# Patient Record
Sex: Female | Born: 1945
Health system: Southern US, Community
[De-identification: ages and names within clinical notes are randomized; demographics above are authoritative.]

## PROBLEM LIST (undated history)

## (undated) DIAGNOSIS — E1129 Type 2 diabetes mellitus with other diabetic kidney complication: Secondary | ICD-10-CM

## (undated) DIAGNOSIS — I10 Essential (primary) hypertension: Secondary | ICD-10-CM

## (undated) DIAGNOSIS — E7800 Pure hypercholesterolemia, unspecified: Secondary | ICD-10-CM

## (undated) HISTORY — DX: Type 2 diabetes mellitus with other diabetic kidney complication: E11.29

## (undated) HISTORY — DX: Pure hypercholesterolemia, unspecified: E78.00

## (undated) HISTORY — DX: Essential (primary) hypertension: I10

---

## 2017-01-30 ENCOUNTER — Encounter: Payer: Self-pay | Admitting: Family Medicine

## 2017-01-30 ENCOUNTER — Ambulatory Visit: Payer: Self-pay | Admitting: Family Medicine

## 2017-01-30 VITALS — BP 118/78 | HR 58 | Temp 97.7°F | Ht 64.0 in | Wt 238.1 lb

## 2017-01-30 DIAGNOSIS — N183 Chronic kidney disease, stage 3 unspecified: Secondary | ICD-10-CM | POA: Insufficient documentation

## 2017-01-30 DIAGNOSIS — J309 Allergic rhinitis, unspecified: Secondary | ICD-10-CM

## 2017-01-30 DIAGNOSIS — Z23 Encounter for immunization: Secondary | ICD-10-CM | POA: Diagnosis not present

## 2017-01-30 DIAGNOSIS — E7800 Pure hypercholesterolemia, unspecified: Secondary | ICD-10-CM

## 2017-01-30 DIAGNOSIS — Z794 Long term (current) use of insulin: Secondary | ICD-10-CM

## 2017-01-30 DIAGNOSIS — F339 Major depressive disorder, recurrent, unspecified: Secondary | ICD-10-CM | POA: Diagnosis not present

## 2017-01-30 DIAGNOSIS — I10 Essential (primary) hypertension: Secondary | ICD-10-CM

## 2017-01-30 DIAGNOSIS — E1122 Type 2 diabetes mellitus with diabetic chronic kidney disease: Secondary | ICD-10-CM

## 2017-01-30 LAB — COMPREHENSIVE METABOLIC PANEL
ALBUMIN: 4.3 g/dL (ref 3.5–5.2)
ALK PHOS: 95 U/L (ref 39–117)
ALT: 16 U/L (ref 0–35)
AST: 17 U/L (ref 0–37)
BILIRUBIN TOTAL: 0.7 mg/dL (ref 0.2–1.2)
BUN: 30 mg/dL — AB (ref 6–23)
CO2: 27 mEq/L (ref 19–32)
CREATININE: 1.21 mg/dL — AB (ref 0.40–1.20)
Calcium: 9.9 mg/dL (ref 8.4–10.5)
Chloride: 101 mEq/L (ref 96–112)
GFR: 46.56 mL/min — ABNORMAL LOW (ref >60.00)
GLUCOSE: 125 mg/dL — AB (ref 70–99)
POTASSIUM: 4.7 meq/L (ref 3.5–5.1)
SODIUM: 137 meq/L (ref 135–145)
TOTAL PROTEIN: 7.4 g/dL (ref 6.0–8.3)

## 2017-01-30 LAB — MICROALBUMIN / CREATININE URINE RATIO
CREATININE, U: 157.6 mg/dL
Microalb Creat Ratio: 0.5 mg/g (ref 0.0–30.0)
Microalb, Ur: 0.8 mg/dL (ref 0.0–1.9)

## 2017-01-30 LAB — CBC
HEMATOCRIT: 40.5 % (ref 36.0–46.0)
HEMOGLOBIN: 13.1 g/dL (ref 12.0–15.0)
MCHC: 32.2 g/dL (ref 30.0–36.0)
MCV: 80.5 fl (ref 78.0–100.0)
PLATELETS: 297 10*3/uL (ref 150.0–400.0)
RBC: 5.04 Mil/uL (ref 3.87–5.11)
RDW: 14.7 % (ref 11.5–15.5)
WBC: 10.1 10*3/uL (ref 4.0–10.5)

## 2017-01-30 LAB — URINALYSIS, ROUTINE W REFLEX MICROSCOPIC
BILIRUBIN URINE: NEGATIVE
HGB URINE DIPSTICK: NEGATIVE
KETONES UR: NEGATIVE
LEUKOCYTES UA: NEGATIVE
NITRITE: NEGATIVE
PH: 5.5 (ref 5.0–8.0)
RBC / HPF: NONE SEEN (ref 0–?)
Specific Gravity, Urine: >=1.03 — AB (ref 1.000–1.030)
Total Protein, Urine: NEGATIVE
UROBILINOGEN UA: 0.2 (ref 0.0–1.0)
Urine Glucose: NEGATIVE

## 2017-01-30 LAB — LIPID PANEL
CHOLESTEROL: 121 mg/dL (ref 0–200)
HDL: 52.6 mg/dL (ref >39.00)
LDL Cholesterol: 53 mg/dL (ref 0–99)
NONHDL: 68.35
Total CHOL/HDL Ratio: 2
Triglycerides: 78 mg/dL (ref 0.0–149.0)
VLDL: 15.6 mg/dL (ref 0.0–40.0)

## 2017-01-30 LAB — TSH: TSH: 3.21 u[IU]/mL (ref 0.35–4.50)

## 2017-01-30 LAB — HEMOGLOBIN A1C: Hgb A1c MFr Bld: 8.1 % — ABNORMAL HIGH (ref 4.6–6.5)

## 2017-01-30 MED ORDER — ATORVASTATIN CALCIUM 20 MG PO TABS: 20.0000 mg | ORAL_TABLET | ORAL | 1 refills | Status: DC

## 2017-01-30 MED ORDER — EXENATIDE ER 2 MG ~~LOC~~ PEN: 2.0000 mg | PEN_INJECTOR | SUBCUTANEOUS | 1 refills | Status: DC

## 2017-01-30 MED ORDER — INSULIN GLARGINE 100 UNIT/ML ~~LOC~~ SOLN: SUBCUTANEOUS | 1 refills | Status: DC

## 2017-01-30 MED ORDER — FLUOXETINE HCL 40 MG PO CAPS: 40.0000 mg | ORAL_CAPSULE | Freq: Every day | ORAL | 1 refills | Status: DC

## 2017-01-30 MED ORDER — FEXOFENADINE HCL 180 MG PO TABS: 180.0000 mg | ORAL_TABLET | Freq: Every day | ORAL | 1 refills | Status: AC

## 2017-01-30 MED ORDER — MOEXIPRIL-HYDROCHLOROTHIAZIDE 15-25 MG PO TABS: 1.0000 | ORAL_TABLET | Freq: Every day | ORAL | 1 refills | Status: DC

## 2017-01-30 MED ORDER — ASPIRIN EC 81 MG PO TBEC: 81.0000 mg | DELAYED_RELEASE_TABLET | Freq: Every day | ORAL | 0 refills | Status: AC

## 2017-01-30 MED ORDER — "INSULIN SYRINGE-NEEDLE U-100 30G X 5/16"" 0.3 ML MISC": 1.0000 | Freq: Every day | 1 refills | Status: DC

## 2017-01-30 NOTE — Progress Notes (Addendum)
Subjective:  Patient ID: Sierra Hines, female    DOB: 1945-05-26  Age: 71 y.o. MRN: [id number]  CC: Follow-up (diabetes)   HPI Sierra Hines presents for follow-up of her ongoing medical issues.  She has seen the doctor is now also under their care.  She is using 45 units of Lantus nightly.  Her fasting blood sugars are in the 85-120 range.  However her 2-hour postprandial sugars are in the 170 range.  She is only taking Lantus at this time.  She has taken examined in the past without issue.  She agrees to retry it again today.  She lives alone and has a significant other.  She has 1 daughter and 4 grandchildren.  They will be visiting her mo Thanksgiving. Her ex lives in [place].   She is exercising.  She has an appointment with the nephrologist this afternoon.    History Sierra Hines has a past medical history of DM (diabetes mellitus) type II controlled with renal manifestation ([place]), Elevated LDL cholesterol level, and HTN (hypertension), benign.   She has a past surgical history that includes Cesarean section (1980).   Her family history is not on file.She reports that she has quit smoking. she has never used smokeless tobacco. Her alcohol and drug histories are not on file.  Outpatient Medications Prior to Visit  Medication Sig Dispense Refill  . atorvastatin (LIPITOR) 20 MG tablet Take 1 tablet by mouth daily.  1  . fexofenadine (ALLEGRA) 180 MG tablet Take 180 mg by mouth. Take 1 tablet by mouth daily.    Marland Kitchen FLUoxetine (PROZAC) 40 MG capsule Take 1 capsule by mouth daily.  1  . insulin glargine (LANTUS) 100 UNIT/ML injection INJ 15 UNI [place] TONIGHT HS. INCREASE BY 2 UNI NIGHTLY UNTIL FASTING SUGAR IS LESS THAN 130    . moexipril-hydrochlorothiazide (UNIRETIC) 15-25 MG tablet Take 1 tablet by mouth daily.  1   No facility-administered medications prior to visit.     ROS Review of Systems  Constitutional: Negative for activity change, appetite change, fatigue and fever.  HENT:  Positive for postnasal drip and sneezing. Negative for voice change.   Eyes: Negative.   Respiratory: Negative.   Cardiovascular: Negative.   Gastrointestinal: Negative.   Endocrine: Negative for polydipsia, polyphagia and polyuria.  Genitourinary: Negative for difficulty urinating and hematuria.  Musculoskeletal: Negative for arthralgias and myalgias.  Allergic/Immunologic: Positive for environmental allergies.  Neurological: Negative for headaches.  Hematological: Negative.   Psychiatric/Behavioral: Negative for dysphoric mood, self-injury and suicidal ideas. The patient is not nervous/anxious.     Objective:  BP 118/78   Pulse (!) 58   Temp 97.7 F (36.5 C) (Oral)   Ht '5\' 4"'$  (1.626 m)   Wt 238 lb 2 oz (108 kg)   SpO2 100%   BMI 40.87 kg/m   Physical Exam  Constitutional: She is oriented to person, place, and time. She appears well-developed and well-nourished. No distress.  HENT:  Head: Normocephalic and atraumatic.  Right Ear: External ear normal.  Left Ear: External ear normal.  Mouth/Throat: Oropharynx is clear and moist. No oropharyngeal exudate.  Eyes: Conjunctivae are normal. Pupils are equal, round, and reactive to light. Right eye exhibits no discharge. Left eye exhibits no discharge. No scleral icterus.  Neck: Neck supple. No JVD present. No tracheal deviation present. No thyromegaly present.  Cardiovascular: Normal rate, regular rhythm and normal heart sounds.  Pulmonary/Chest: Effort normal and breath sounds normal. No stridor.  Abdominal: Bowel sounds are normal.  Musculoskeletal: She exhibits no edema or tenderness.  Lymphadenopathy:    She has no cervical adenopathy.  Neurological: She is alert and oriented to person, place, and time.  Skin: Skin is warm and dry. She is not diaphoretic.  Psychiatric: She has a normal mood and affect. Her behavior is normal. Thought content normal.    FH, social hx and pmh reviewed.  Assessment & Plan:   Sierra Hines was seen  today for follow-up.  Diagnoses and all orders for this visit:  Type 2 diabetes mellitus with stage 3 chronic kidney disease, with long-term current use of insulin (HCC) -     insulin glargine (LANTUS) 100 UNIT/ML injection; May use up to 60 Units nightly as directed -     moexipril-hydrochlorothiazide (UNIRETIC) 15-25 MG tablet; Take 1 tablet daily by mouth. Take 1 tablet by mouth daily. -     Exenatide ER 2 MG PEN; Inject 2 mg once a week into the skin. -     Comp Met (CMET) -     CBC -     TSH -     Urinalysis, Routine w reflex microscopic -     Urine Microalbumin w/creat. ratio -     HgB A1c -     Insulin Syringe-Needle U-100 (GLOBAL INSULIN SYRINGES) 30G X 5/16" 0.3 ML MISC; 1 Syringe daily by Does not apply route. -     aspirin EC 81 MG tablet; Take 1 tablet (81 mg total) daily by mouth.  Allergic rhinitis, unspecified seasonality, unspecified trigger -     fexofenadine (ALLEGRA) 180 MG tablet; Take 1 tablet (180 mg total) daily by mouth. Take 1 tablet by mouth daily.  HTN (hypertension), benign -     moexipril-hydrochlorothiazide (UNIRETIC) 15-25 MG tablet; Take 1 tablet daily by mouth. Take 1 tablet by mouth daily. -     Comp Met (CMET) -     CBC -     TSH -     Urinalysis, Routine w reflex microscopic -     aspirin EC 81 MG tablet; Take 1 tablet (81 mg total) daily by mouth.  Depression, recurrent (HCC) -     FLUoxetine (PROZAC) 40 MG capsule; Take 1 capsule (40 mg total) daily by mouth. Take 1 capsule by mouth daily.  Elevated LDL cholesterol level -     atorvastatin (LIPITOR) 20 MG tablet; Take 1 tablet (20 mg total) 1 day or 1 dose for 1 dose by mouth. Take 1 tablet by mouth daily. -     Comp Met (CMET) -     Lipid Profile -     TSH -     aspirin EC 81 MG tablet; Take 1 tablet (81 mg total) daily by mouth.  Need for influenza vaccination -     Flu vaccine HIGH DOSE PF   I have changed Sierra Hines's atorvastatin, fexofenadine, FLUoxetine, insulin glargine, and  moexipril-hydrochlorothiazide. I am also having her start on Exenatide ER, Insulin Syringe-Needle U-100, and aspirin EC.  Meds ordered this encounter  Medications  . DISCONTD: moexipril-hydrochlorothiazide (UNIRETIC) 15-25 MG tablet    Sig: Take 1 tablet by mouth daily.    Refill:  1  . DISCONTD: insulin glargine (LANTUS) 100 UNIT/ML injection    Sig: INJ 15 UNI [place] TONIGHT HS. INCREASE BY 2 UNI NIGHTLY UNTIL FASTING SUGAR IS LESS THAN 130  . DISCONTD: FLUoxetine (PROZAC) 40 MG capsule    Sig: Take 1 capsule by mouth daily.    Refill:  1  .  DISCONTD: fexofenadine (ALLEGRA) 180 MG tablet    Sig: Take 180 mg by mouth. Take 1 tablet by mouth daily.  Marland Kitchen DISCONTD: atorvastatin (LIPITOR) 20 MG tablet    Sig: Take 1 tablet by mouth daily.    Refill:  1  . atorvastatin (LIPITOR) 20 MG tablet    Sig: Take 1 tablet (20 mg total) 1 day or 1 dose for 1 dose by mouth. Take 1 tablet by mouth daily.    Dispense:  180 tablet    Refill:  1  . fexofenadine (ALLEGRA) 180 MG tablet    Sig: Take 1 tablet (180 mg total) daily by mouth. Take 1 tablet by mouth daily.    Dispense:  180 tablet    Refill:  1  . FLUoxetine (PROZAC) 40 MG capsule    Sig: Take 1 capsule (40 mg total) daily by mouth. Take 1 capsule by mouth daily.    Dispense:  180 capsule    Refill:  1  . insulin glargine (LANTUS) 100 UNIT/ML injection    Sig: May use up to 60 Units nightly as directed    Dispense:  30 mL    Refill:  1  . moexipril-hydrochlorothiazide (UNIRETIC) 15-25 MG tablet    Sig: Take 1 tablet daily by mouth. Take 1 tablet by mouth daily.    Dispense:  180 tablet    Refill:  1  . Exenatide ER 2 MG PEN    Sig: Inject 2 mg once a week into the skin.    Dispense:  12 each    Refill:  1  . Insulin Syringe-Needle U-100 (GLOBAL INSULIN SYRINGES) 30G X 5/16" 0.3 ML MISC    Sig: 1 Syringe daily by Does not apply route.    Dispense:  100 each    Refill:  1  . aspirin EC 81 MG tablet    Sig: Take 1 tablet (81 mg total)  daily by mouth.    Dispense:  365 tablet    Refill:  0   I have encouraged her to continue exercising with weight loss.  Follow-up: Return in about 3 months (around 05/02/2017).  Libby Maw, MD

## 2017-03-08 ENCOUNTER — Telehealth: Payer: Self-pay | Admitting: *Deleted

## 2017-03-08 NOTE — Telephone Encounter (Signed)
[facility]. Medical records will be sent interoffice to [facility].

## 2017-04-05 ENCOUNTER — Telehealth: Payer: Self-pay | Admitting: Family Medicine

## 2017-04-05 NOTE — Telephone Encounter (Signed)
Spoke with Sierra Hines. Patient stated that she is not interested in scheduling AWV at this time. SF

## 2017-05-02 ENCOUNTER — Ambulatory Visit: Payer: Medicare Other | Admitting: Family Medicine

## 2017-05-02 ENCOUNTER — Encounter: Payer: Self-pay | Admitting: Family Medicine

## 2017-05-02 VITALS — BP 120/70 | HR 120 | Ht 64.0 in | Wt 233.5 lb

## 2017-05-02 DIAGNOSIS — I10 Essential (primary) hypertension: Secondary | ICD-10-CM

## 2017-05-02 DIAGNOSIS — E7800 Pure hypercholesterolemia, unspecified: Secondary | ICD-10-CM | POA: Diagnosis not present

## 2017-05-02 DIAGNOSIS — N183 Chronic kidney disease, stage 3 unspecified: Secondary | ICD-10-CM

## 2017-05-02 DIAGNOSIS — Z794 Long term (current) use of insulin: Secondary | ICD-10-CM

## 2017-05-02 DIAGNOSIS — E1122 Type 2 diabetes mellitus with diabetic chronic kidney disease: Secondary | ICD-10-CM | POA: Diagnosis not present

## 2017-05-02 DIAGNOSIS — Z1159 Encounter for screening for other viral diseases: Secondary | ICD-10-CM

## 2017-05-02 DIAGNOSIS — M8589 Other specified disorders of bone density and structure, multiple sites: Secondary | ICD-10-CM

## 2017-05-02 LAB — MICROALBUMIN / CREATININE URINE RATIO
CREATININE, U: 224.9 mg/dL
MICROALB UR: 1.4 mg/dL (ref 0.0–1.9)
Microalb Creat Ratio: 0.6 mg/g (ref 0.0–30.0)

## 2017-05-02 LAB — BASIC METABOLIC PANEL
BUN: 26 mg/dL — ABNORMAL HIGH (ref 6–23)
CALCIUM: 9.7 mg/dL (ref 8.4–10.5)
CO2: 28 meq/L (ref 19–32)
Chloride: 100 mEq/L (ref 96–112)
Creatinine, Ser: 1.3 mg/dL — ABNORMAL HIGH (ref 0.40–1.20)
GFR: 42.83 mL/min — AB (ref >60.00)
Glucose, Bld: 132 mg/dL — ABNORMAL HIGH (ref 70–99)
Potassium: 4.4 mEq/L (ref 3.5–5.1)
SODIUM: 137 meq/L (ref 135–145)

## 2017-05-02 LAB — HEMOGLOBIN A1C: HEMOGLOBIN A1C: 6.9 % — AB (ref 4.6–6.5)

## 2017-05-02 MED ORDER — INSULIN GLARGINE 100 UNIT/ML ~~LOC~~ SOLN: SUBCUTANEOUS | 1 refills | Status: DC

## 2017-05-02 MED ORDER — "INSULIN SYRINGE-NEEDLE U-100 30G X 5/16"" 1 ML MISC": 3 refills | Status: DC

## 2017-05-02 MED ORDER — EXENATIDE ER 2 MG ~~LOC~~ PEN: 2.0000 mg | PEN_INJECTOR | SUBCUTANEOUS | 1 refills | Status: DC

## 2017-05-02 NOTE — Progress Notes (Addendum)
Subjective:  Patient ID: Sierra Hines, female    DOB: [birth year]  Age: 72 y.o. MRN: [id number]  CC: Follow-up   HPI Sierra Hines presents for seems to be doing better.  She is tolerating the examiner Tide weekly.  Feels as though her postprandial blood glucoses have been improved with it.  She is taking 50 units of Lantus nightly.  Fasting sugars have been in the less than 120 range.  She continues to walk for exercise.  She is staying socially engaged with her female friend.  She sees her grandchildren in [place] as much as possible.  Blood pressures been controlled.  She is checking her feet.  She is she will have her eye exam in May.  Last Pap smear was back in the fall and was normal.  Renal docs checked her vitamin D and found it to be low and have got her on the high-dose replacement.  She will follow-up with him in March.  Outpatient Medications Prior to Visit  Medication Sig Dispense Refill  . aspirin EC 81 MG tablet Take 1 tablet (81 mg total) daily by mouth. 365 tablet 0  . fexofenadine (ALLEGRA) 180 MG tablet Take 1 tablet (180 mg total) daily by mouth. Take 1 tablet by mouth daily. 180 tablet 1  . FLUoxetine (PROZAC) 40 MG capsule Take 1 capsule (40 mg total) daily by mouth. Take 1 capsule by mouth daily. 180 capsule 1  . Vitamin D, Ergocalciferol, (DRISDOL) 50000 units CAPS capsule Take 1 capsule by mouth once a week.    . Exenatide ER 2 MG PEN Inject 2 mg once a week into the skin. 12 each 1  . insulin glargine (LANTUS) 100 UNIT/ML injection May use up to 60 Units nightly as directed 30 mL 1  . moexipril-hydrochlorothiazide (UNIRETIC) 15-25 MG tablet Take 1 tablet daily by mouth. Take 1 tablet by mouth daily. 180 tablet 1  . atorvastatin (LIPITOR) 20 MG tablet Take 1 tablet (20 mg total) 1 day or 1 dose for 1 dose by mouth. Take 1 tablet by mouth daily. 180 tablet 1  . Insulin Syringe-Needle U-100 (GLOBAL INSULIN SYRINGES) 30G X 5/16" 0.3 ML MISC 1 Syringe daily by Does not apply  route. 100 each 1   No facility-administered medications prior to visit.     ROS Review of Systems  Constitutional: Negative.   HENT: Negative.   Eyes: Negative.   Respiratory: Negative.   Cardiovascular: Negative.   Gastrointestinal: Negative.   Endocrine: Negative for polyphagia and polyuria.  Genitourinary: Negative.   Musculoskeletal: Negative.   Skin: Negative.   Allergic/Immunologic: Negative for immunocompromised state.  Neurological: Negative for weakness and headaches.  Hematological: Does not bruise/bleed easily.  Psychiatric/Behavioral: Negative.     Objective:  BP 120/70 (BP Location: Left Arm, Patient Position: Sitting, Cuff Size: Large)   Pulse (!) 120   Ht 5\' 4"  (1.626 m)   Wt 233 lb 8 oz (105.9 kg)   SpO2 97%   BMI 40.08 kg/m   BP Readings from Last 3 Encounters:  05/02/17 120/70  01/30/17 118/78    Wt Readings from Last 3 Encounters:  05/02/17 233 lb 8 oz (105.9 kg)  01/30/17 238 lb 2 oz (108 kg)    Physical Exam  Constitutional: She is oriented to person, place, and time. She appears well-developed and well-nourished. No distress.  HENT:  Head: Normocephalic and atraumatic.  Right Ear: External ear normal.  Left Ear: External ear normal.  Mouth/Throat: Oropharynx is clear and  moist. No oropharyngeal exudate.  Eyes: Conjunctivae are normal. Pupils are equal, round, and reactive to light. Right eye exhibits no discharge. Left eye exhibits no discharge. No scleral icterus.  Neck: Neck supple. No JVD present. No tracheal deviation present. No thyromegaly present.  Cardiovascular: Normal rate, regular rhythm and normal heart sounds.  Pulses:      Dorsalis pedis pulses are 1+ on the right side, and 1+ on the left side.       Posterior tibial pulses are 1+ on the right side, and 1+ on the left side.  Pulmonary/Chest: Effort normal and breath sounds normal.  Abdominal: She exhibits no distension. There is no tenderness. There is no rebound.    Lymphadenopathy:    She has no cervical adenopathy.  Neurological: She is alert and oriented to person, place, and time.  Skin: Skin is warm and dry. She is not diaphoretic.  Psychiatric: She has a normal mood and affect. Her behavior is normal.   Diabetic Foot Exam   Diabetic Foot Exam - Simple   Simple Foot Form  Visual Inspection  No deformities, no ulcerations, no other skin breakdown bilaterally: Yes  Sensation Testing  Intact to touch and monofilament testing bilaterally: Yes  Pulse Check  Posterior Tibialis and Dorsalis pulse intact bilaterally: Yes  Comments   Lab Results  Component Value Date   WBC 10.1 01/30/2017   HGB 13.1 01/30/2017   HCT 40.5 01/30/2017   PLT 297.0 01/30/2017   GLUCOSE 132 (H) 05/02/2017   CHOL 121 01/30/2017   TRIG 78.0 01/30/2017   HDL 52.60 01/30/2017   LDLCALC 53 01/30/2017   ALT 16 01/30/2017   AST 17 01/30/2017   NA 137 05/02/2017   K 4.4 05/02/2017   CL 100 05/02/2017   CREATININE 1.30 (H) 05/02/2017   BUN 26 (H) 05/02/2017   CO2 28 05/02/2017   TSH 3.21 01/30/2017   HGBA1C 6.9 (H) 05/02/2017   MICROALBUR 1.4 05/02/2017    Patient was never admitted.  Assessment & Plan:   Sierra Hines was seen today for follow-up.  Diagnoses and all orders for this visit:  HTN (hypertension), benign -     lisinopril-hydrochlorothiazide (PRINZIDE,ZESTORETIC) 20-25 MG tablet; Take 1 tablet by mouth daily.  Type 2 diabetes mellitus with stage 3 chronic kidney disease, with long-term current use of insulin (HCC) -     Exenatide ER 2 MG PEN; Inject 2 mg into the skin once a week. -     insulin glargine (LANTUS) 100 UNIT/ML injection; May use up to 60 Units nightly as directed -     Basic metabolic panel -     Hemoglobin A1c -     Microalbumin / creatinine urine ratio -     lisinopril-hydrochlorothiazide (PRINZIDE,ZESTORETIC) 20-25 MG tablet; Take 1 tablet by mouth daily.  Elevated LDL cholesterol level  Encounter for hepatitis C screening test  for low risk patient -     Hepatitis C antibody  Osteopenia of multiple sites -     DG Bone Density; Future  Other orders -     Insulin Syringe-Needle U-100 30G X 5/16" 1 ML MISC; To use with insulin.   I have discontinued Sierra Hines's moexipril-hydrochlorothiazide and Insulin Syringe-Needle U-100. I have also changed her Exenatide ER. Additionally, I am having her start on Insulin Syringe-Needle U-100 and lisinopril-hydrochlorothiazide. Lastly, I am having her maintain her atorvastatin, fexofenadine, FLUoxetine, aspirin EC, Vitamin D (Ergocalciferol), and insulin glargine.  Meds ordered this encounter  Medications  . Insulin  Syringe-Needle U-100 30G X 5/16" 1 ML MISC    Sig: To use with insulin.    Dispense:  100 each    Refill:  3  . Exenatide ER 2 MG PEN    Sig: Inject 2 mg into the skin once a week.    Dispense:  12 each    Refill:  1  . insulin glargine (LANTUS) 100 UNIT/ML injection    Sig: May use up to 60 Units nightly as directed    Dispense:  60 mL    Refill:  1  . lisinopril-hydrochlorothiazide (PRINZIDE,ZESTORETIC) 20-25 MG tablet    Sig: Take 1 tablet by mouth daily.    Dispense:  90 tablet    Refill:  3     Follow-up: Return in about 3 months (around 07/30/2017).  Mliss SaxWilliam Alfred Latrise Bowland, MD

## 2017-05-03 ENCOUNTER — Other Ambulatory Visit (HOSPITAL_BASED_OUTPATIENT_CLINIC_OR_DEPARTMENT_OTHER): Payer: Medicare Other

## 2017-05-03 LAB — HEPATITIS C ANTIBODY
HEP C AB: NONREACTIVE
SIGNAL TO CUT-OFF: 0.01 (ref <1.00)

## 2017-05-09 ENCOUNTER — Other Ambulatory Visit: Payer: Self-pay

## 2017-05-09 ENCOUNTER — Telehealth: Payer: Self-pay | Admitting: Family Medicine

## 2017-05-09 ENCOUNTER — Ambulatory Visit (HOSPITAL_BASED_OUTPATIENT_CLINIC_OR_DEPARTMENT_OTHER)
Admission: RE | Admit: 2017-05-09 | Discharge: 2017-05-09 | Disposition: A | Discharge status: Home / Self Care | Payer: Medicare Other | Source: Ambulatory Visit | Attending: Family Medicine | Admitting: Family Medicine

## 2017-05-09 DIAGNOSIS — F339 Major depressive disorder, recurrent, unspecified: Secondary | ICD-10-CM

## 2017-05-09 DIAGNOSIS — M8589 Other specified disorders of bone density and structure, multiple sites: Secondary | ICD-10-CM

## 2017-05-09 DIAGNOSIS — E7800 Pure hypercholesterolemia, unspecified: Secondary | ICD-10-CM

## 2017-05-09 MED ORDER — ATORVASTATIN CALCIUM 20 MG PO TABS: 20.0000 mg | ORAL_TABLET | Freq: Every day | ORAL | 1 refills | Status: DC

## 2017-05-09 MED ORDER — FLUOXETINE HCL 40 MG PO CAPS: 40.0000 mg | ORAL_CAPSULE | Freq: Every day | ORAL | 1 refills | Status: DC

## 2017-05-09 MED ORDER — LISINOPRIL-HYDROCHLOROTHIAZIDE 20-25 MG PO TABS: 1.0000 | ORAL_TABLET | Freq: Every day | ORAL | 3 refills | Status: DC

## 2017-05-09 NOTE — Telephone Encounter (Signed)
[facility] the moexipril/hctz 15-25 mg tablets have been discontinued. What would you like to change it too?

## 2017-05-09 NOTE — Telephone Encounter (Signed)
I left a detailed voicemail for patient letting her know to discontinue the current BP medication and to start the new one that has been sent in. I also let patient know that she needs to make an appointment for a month away so that we can check and make sure her BP is doing well on the new medication. Advised patient to call the office back if she has any questions/concerns.

## 2017-05-09 NOTE — Addendum Note (Signed)
Addended by: Andrez GrimeKREMER, Robbie Nangle A on: 05/09/2017 11:55 AM   Modules accepted: Orders

## 2017-05-09 NOTE — Telephone Encounter (Signed)
I switched her to zestoretic. Stop the uniretic. Fu in one month for a bp check.

## 2017-07-31 ENCOUNTER — Encounter: Payer: Self-pay | Admitting: Family Medicine

## 2017-07-31 ENCOUNTER — Ambulatory Visit: Payer: Medicare Other | Admitting: Family Medicine

## 2017-07-31 VITALS — BP 130/80 | HR 116 | Ht 64.0 in | Wt 233.4 lb

## 2017-07-31 DIAGNOSIS — Z794 Long term (current) use of insulin: Secondary | ICD-10-CM

## 2017-07-31 DIAGNOSIS — M85859 Other specified disorders of bone density and structure, unspecified thigh: Secondary | ICD-10-CM

## 2017-07-31 DIAGNOSIS — E1122 Type 2 diabetes mellitus with diabetic chronic kidney disease: Secondary | ICD-10-CM

## 2017-07-31 DIAGNOSIS — N183 Chronic kidney disease, stage 3 unspecified: Secondary | ICD-10-CM

## 2017-07-31 DIAGNOSIS — E7800 Pure hypercholesterolemia, unspecified: Secondary | ICD-10-CM

## 2017-07-31 DIAGNOSIS — E559 Vitamin D deficiency, unspecified: Secondary | ICD-10-CM | POA: Diagnosis not present

## 2017-07-31 DIAGNOSIS — I10 Essential (primary) hypertension: Secondary | ICD-10-CM | POA: Diagnosis not present

## 2017-07-31 LAB — LIPID PANEL
CHOLESTEROL: 128 mg/dL (ref 0–200)
HDL: 56.3 mg/dL (ref >39.00)
LDL Cholesterol: 47 mg/dL (ref 0–99)
NonHDL: 72.18
Total CHOL/HDL Ratio: 2
Triglycerides: 125 mg/dL (ref 0.0–149.0)
VLDL: 25 mg/dL (ref 0.0–40.0)

## 2017-07-31 LAB — BASIC METABOLIC PANEL
BUN: 26 mg/dL — ABNORMAL HIGH (ref 6–23)
CO2: 24 mEq/L (ref 19–32)
Calcium: 9.4 mg/dL (ref 8.4–10.5)
Chloride: 100 mEq/L (ref 96–112)
Creatinine, Ser: 1.53 mg/dL — ABNORMAL HIGH (ref 0.40–1.20)
GFR: 35.47 mL/min — ABNORMAL LOW (ref >60.00)
Glucose, Bld: 127 mg/dL — ABNORMAL HIGH (ref 70–99)
POTASSIUM: 4.3 meq/L (ref 3.5–5.1)
Sodium: 135 mEq/L (ref 135–145)

## 2017-07-31 LAB — HEMOGLOBIN A1C: HEMOGLOBIN A1C: 6.5 % (ref 4.6–6.5)

## 2017-07-31 LAB — CBC
HCT: 40.1 % (ref 36.0–46.0)
Hemoglobin: 13.1 g/dL (ref 12.0–15.0)
MCHC: 32.6 g/dL (ref 30.0–36.0)
MCV: 78.4 fl (ref 78.0–100.0)
Platelets: 320 10*3/uL (ref 150.0–400.0)
RBC: 5.11 Mil/uL (ref 3.87–5.11)
RDW: 15.2 % (ref 11.5–15.5)
WBC: 10.2 10*3/uL (ref 4.0–10.5)

## 2017-07-31 LAB — MICROALBUMIN / CREATININE URINE RATIO
Creatinine,U: 272.6 mg/dL
MICROALB UR: 51.5 mg/dL — AB (ref 0.0–1.9)
MICROALB/CREAT RATIO: 18.9 mg/g (ref 0.0–30.0)

## 2017-07-31 MED ORDER — CALCIUM 500 MG PO TABS: ORAL_TABLET | ORAL | 3 refills | Status: AC

## 2017-07-31 NOTE — Progress Notes (Signed)
Subjective:  Patient ID: Sierra Hines, female    DOB: May 29, 1945  Age: 72 y.o. MRN: [id number]  CC: Follow-up   HPI Sierra Hines presents for follow-up of her blood pressure diabetes and developed elevated LDL cholesterol.  She is fasting this morning.  Blood sugars have been doing much better with a combination of 50 units of Lantus in the evening and a exenatide once weekly.  She has been adjusting her Lantus by her evening blood sugars.  She is feeling better and is exercising more.  Renal function is improving.  Retinal exam he has stable without diabetic retinopathy.  There is some evidence for early MD.  Ophthalmology does not feel as though it needs to be treated at this point.  Outpatient Medications Prior to Visit  Medication Sig Dispense Refill  . aspirin EC 81 MG tablet Take 1 tablet (81 mg total) daily by mouth. 365 tablet 0  . Exenatide ER 2 MG PEN Inject 2 mg into the skin once a week. 12 each 1  . fexofenadine (ALLEGRA) 180 MG tablet Take 1 tablet (180 mg total) daily by mouth. Take 1 tablet by mouth daily. 180 tablet 1  . FLUoxetine (PROZAC) 40 MG capsule Take 1 capsule (40 mg total) by mouth daily. Take 1 capsule by mouth daily. 90 capsule 1  . insulin glargine (LANTUS) 100 UNIT/ML injection May use up to 60 Units nightly as directed 60 mL 1  . Insulin Syringe-Needle U-100 30G X 5/16" 1 ML MISC To use with insulin. 100 each 3  . lisinopril-hydrochlorothiazide (PRINZIDE,ZESTORETIC) 20-25 MG tablet Take 1 tablet by mouth daily. 90 tablet 3  . Vitamin D, Ergocalciferol, (DRISDOL) 50000 units CAPS capsule Take 1 capsule by mouth once a week.    Marland Kitchen atorvastatin (LIPITOR) 20 MG tablet Take 1 tablet (20 mg total) by mouth daily for 1 dose. 90 tablet 1   No facility-administered medications prior to visit.     ROS Review of Systems  Constitutional: Negative for activity change, chills, fatigue and unexpected weight change.  HENT: Negative.   Eyes: Negative for photophobia and  visual disturbance.  Respiratory: Negative.   Cardiovascular: Negative.   Gastrointestinal: Negative.   Endocrine: Negative for polyphagia and polyuria.  Genitourinary: Negative for frequency and urgency.  Musculoskeletal: Negative for gait problem and joint swelling.  Skin: Negative.   Allergic/Immunologic: Negative for immunocompromised state.  Neurological: Negative for weakness and headaches.  Hematological: Does not bruise/bleed easily.  Psychiatric/Behavioral: Negative.     Objective:  BP 130/80   Pulse (!) 116   Ht  (1.626 m)   Wt 233 lb 6 oz (105.9 kg)   SpO2 97%   BMI 40.06 kg/m   BP Readings from Last 3 Encounters:  07/31/17 130/80  05/02/17 120/70  01/30/17 118/78    Wt Readings from Last 3 Encounters:  07/31/17 233 lb 6 oz (105.9 kg)  05/02/17 233 lb 8 oz (105.9 kg)  01/30/17 238 lb 2 oz (108 kg)    Physical Exam  Constitutional: She is oriented to person, place, and time. She appears well-developed and well-nourished.  HENT:  Head: Normocephalic and atraumatic.  Right Ear: External ear normal.  Left Ear: External ear normal.  Eyes: Right eye exhibits no discharge. Left eye exhibits no discharge. No scleral icterus.  Neck: No JVD present. No tracheal deviation present.  Cardiovascular: Normal rate, regular rhythm and normal heart sounds.  Pulses:      Dorsalis pedis pulses are 1+ on the  right side, and 1+ on the left side.  Pulmonary/Chest: Effort normal.  Musculoskeletal: She exhibits no edema.  Neurological: She is alert and oriented to person, place, and time.  Skin: Skin is warm and dry. Capillary refill takes 2 to 3 seconds.  Psychiatric: She has a normal mood and affect. Her behavior is normal.   Diabetic Foot Exam - Simple   Simple Foot Form  Visual Inspection  No deformities, no ulcerations, no other skin breakdown bilaterally: Yes  Sensation Testing  Intact to touch and monofilament testing bilaterally: Yes  Pulse Check  See  comments: Yes  Comments  Feet are cavus.   Could not find DP pulses.   Cap refill 2-3 seconds.    Lab Results  Component Value Date   WBC 10.1 01/30/2017   HGB 13.1 01/30/2017   HCT 40.5 01/30/2017   PLT 297.0 01/30/2017   GLUCOSE 132 (H) 05/02/2017   CHOL 121 01/30/2017   TRIG 78.0 01/30/2017   HDL 52.60 01/30/2017   LDLCALC 53 01/30/2017   ALT 16 01/30/2017   AST 17 01/30/2017   NA 137 05/02/2017   K 4.4 05/02/2017   CL 100 05/02/2017   CREATININE 1.30 (H) 05/02/2017   BUN 26 (H) 05/02/2017   CO2 28 05/02/2017   TSH 3.21 01/30/2017   HGBA1C 6.9 (H) 05/02/2017   MICROALBUR 1.4 05/02/2017    Dg Bone Density  Result Date: 05/09/2017 EXAM: DUAL X-RAY ABSORPTIOMETRY (DXA) FOR BONE MINERAL DENSITY IMPRESSION: Referring Physician:  Talmadge Coventry [facility] PATIENT: Name: Sierra Hines Patient ID: [id number] Birth Date: Oct 01, 1945 Height: 63.0 in. Sex: Female Measured: 05/09/2017 Weight: 232.8 lbs. Indications: Advanced Age, Caucasian, Diabetic, Estrogen Deficiency, Low Calcium Intake, Parent hip Fx, Post Menopausal, Previous Tobacco User, Family Hist. (Parent hip fracture), History of Fracture (Adult) Fractures: Forearm Treatments: [place], [place] ASSESSMENT: The BMD measured at Femur Neck Left is 0.829 g/cm2 with a T-score of -1.5. This patient is considered osteopenic according to World Health Organization [facility]) criteria. Site Region Measured Date Measured Age WHO YA BMD Classification T-score AP Spine L1-L4 05/09/2017 71.7 Normal 0.7 1.264 g/cm2 DualFemur Neck Left 05[age]09/2017 71.7 years Low Bone Mass -1.5 0.829 g/cm2 World Health Organization [facility]) criteria for post-menopausal, Caucasian Women: Normal        T-score at or above -1 SD Low Bone Mass T-score between -1 and -2.5 SD Osteoporosis  T-score at or below -2.5 SD RECOMMENDATION: National Osteoporosis Foundation recommends that FDA-approved medical therapies be considered in postmenopausal women and men age 69 or older with a: 1. Hip  or vertebral (clinical or morphometric) fracture. 2. T-score of < -2.5 at the spine or hip. 3. Ten-year fracture probability by FRAX of 3% or greater for hip fracture or 20% or greater for major osteoporotic fracture. All treatment decisions require clinical judgment and consideration of individual patient factors, including patient preferences, co-morbidities, previous drug use, risk factors not captured in the FRAX model (e.g. falls, vitamin D deficiency, increased bone turnover, interval significant decline in bone density) and possible under - or over-estimation of fracture risk by FRAX. All patients should ensure an adequate intake of dietary calcium (1200 mg/d) and vitamin D (800 IU daily) unless contraindicated. FOLLOW-UP: People with diagnosed cases of osteoporosis or at high risk for fracture should have regular bone mineral density tests. For patients eligible for Medicare, routine testing is allowed once every 2 years. The testing frequency can be increased to one year for patients who have rapidly progressing disease, those who are receiving  or discontinuing medical therapy to restore bone mass, or have additional risk factors. I have reviewed this report and agree with the above findings. [facility] Radiology Patient: Carlyle Lipa   Referring Physician: Talmadge Coventry [facility] Birth Date: 1945/09/27 [age] Patient ID: [id number] Height: 63.0 in. Weight: 232.8 lbs. Measured: 05/09/2017 2:22:06 PM (16 SP 2) Sex: Female Ethnicity: White Analyzed: 05/09/2017 2:24:53 PM (16 SP 2) FRAX* 10-year Probability of Fracture Based on femoral neck BMD: DualFemur (Left) Major Osteoporotic Fracture: 21.7% Hip Fracture:                5.4% Population:                  [place] (Caucasian) Risk Factors: Family Hist. (Parent hip fracture), History of Fracture (Adult) *FRAX is a Armed forces logistics/support/administrative officer of the [employer] for Metabolic Bone Disease, a World Science writer (WHO) [employer]. ASSESSMENT: The probability of a major osteoporotic fracture is 21.7% within the next ten years. The probability of a hip fracture is 5.4% within the next ten years. Electronically Signed   By: Bretta Bang III M.D.   On: 05/09/2017 14:52    Assessment & Plan:   Kaytelynn was seen today for follow-up.  Diagnoses and all orders for this visit:  HTN (hypertension), benign -     Basic metabolic panel -     CBC  Type 2 diabetes mellitus with stage 3 chronic kidney disease, with long-term current use of insulin (HCC) -     Basic metabolic panel -     Hemoglobin A1c -     Microalbumin / creatinine urine ratio -     CBC  Elevated LDL cholesterol level -     Lipid panel  Vitamin D deficiency  Osteopenia of neck of femur, unspecified laterality -     Calcium 500 MG tablet; Take one tablet twice daily.   I am having Annaleigha Plog start on Calcium. I am also having her maintain her fexofenadine, aspirin EC, Vitamin D (Ergocalciferol), Insulin Syringe-Needle U-100, Exenatide ER, insulin glargine, lisinopril-hydrochlorothiazide, atorvastatin, and FLUoxetine.  Meds ordered this encounter  Medications  . Calcium 500 MG tablet    Sig: Take one tablet twice daily.    Dispense:  60 tablet    Refill:  3   Encouraged the patient to keep up the good work and continue to exercise and try to lose some weight.  Letter noted that we adjust Lantus by fasting blood sugars the following morning.  She said that she will do that.  We will continue her on her current regimen.  Recheck vitamin D in 3 months.  Follow-up: Return in about 3 months (around 10/31/2017).  Mliss Sax, MD

## 2017-07-31 NOTE — Patient Instructions (Signed)

## 2017-10-31 ENCOUNTER — Ambulatory Visit: Payer: Medicare Other | Admitting: Family Medicine

## 2017-11-02 ENCOUNTER — Ambulatory Visit: Payer: Medicare Other | Admitting: Family Medicine

## 2017-11-07 ENCOUNTER — Ambulatory Visit: Payer: Medicare Other | Admitting: Family Medicine

## 2017-11-07 ENCOUNTER — Encounter: Payer: Self-pay | Admitting: Family Medicine

## 2017-11-07 ENCOUNTER — Other Ambulatory Visit: Payer: Self-pay | Admitting: Family Medicine

## 2017-11-07 VITALS — BP 118/60 | HR 105 | Ht 64.0 in | Wt 230.4 lb

## 2017-11-07 DIAGNOSIS — F339 Major depressive disorder, recurrent, unspecified: Secondary | ICD-10-CM | POA: Diagnosis not present

## 2017-11-07 DIAGNOSIS — I10 Essential (primary) hypertension: Secondary | ICD-10-CM | POA: Diagnosis not present

## 2017-11-07 DIAGNOSIS — E860 Dehydration: Secondary | ICD-10-CM

## 2017-11-07 DIAGNOSIS — Z794 Long term (current) use of insulin: Secondary | ICD-10-CM

## 2017-11-07 DIAGNOSIS — N183 Chronic kidney disease, stage 3 unspecified: Secondary | ICD-10-CM

## 2017-11-07 DIAGNOSIS — E1122 Type 2 diabetes mellitus with diabetic chronic kidney disease: Secondary | ICD-10-CM

## 2017-11-07 DIAGNOSIS — E7800 Pure hypercholesterolemia, unspecified: Secondary | ICD-10-CM | POA: Diagnosis not present

## 2017-11-07 DIAGNOSIS — D649 Anemia, unspecified: Secondary | ICD-10-CM

## 2017-11-07 LAB — BASIC METABOLIC PANEL
BUN: 33 mg/dL — ABNORMAL HIGH (ref 6–23)
CHLORIDE: 99 meq/L (ref 96–112)
CO2: 25 mEq/L (ref 19–32)
Calcium: 9.7 mg/dL (ref 8.4–10.5)
Creatinine, Ser: 1.67 mg/dL — ABNORMAL HIGH (ref 0.40–1.20)
GFR: 32.03 mL/min — AB (ref >60.00)
GLUCOSE: 102 mg/dL — AB (ref 70–99)
Potassium: 4.4 mEq/L (ref 3.5–5.1)
SODIUM: 134 meq/L — AB (ref 135–145)

## 2017-11-07 LAB — CBC
HEMATOCRIT: 35.5 % — AB (ref 36.0–46.0)
Hemoglobin: 11.8 g/dL — ABNORMAL LOW (ref 12.0–15.0)
MCHC: 33.1 g/dL (ref 30.0–36.0)
MCV: 75.6 fl — ABNORMAL LOW (ref 78.0–100.0)
PLATELETS: 315 10*3/uL (ref 150.0–400.0)
RBC: 4.7 Mil/uL (ref 3.87–5.11)
RDW: 14.5 % (ref 11.5–15.5)
WBC: 9.1 10*3/uL (ref 4.0–10.5)

## 2017-11-07 LAB — LIPID PANEL
CHOLESTEROL: 105 mg/dL (ref 0–200)
HDL: 55.2 mg/dL (ref >39.00)
LDL Cholesterol: 36 mg/dL (ref 0–99)
NonHDL: 50.23
Total CHOL/HDL Ratio: 2
Triglycerides: 72 mg/dL (ref 0.0–149.0)
VLDL: 14.4 mg/dL (ref 0.0–40.0)

## 2017-11-07 LAB — URINALYSIS, ROUTINE W REFLEX MICROSCOPIC
Bilirubin Urine: NEGATIVE
HGB URINE DIPSTICK: NEGATIVE
KETONES UR: NEGATIVE
Nitrite: NEGATIVE
Specific Gravity, Urine: >=1.03 — AB (ref 1.000–1.030)
Total Protein, Urine: NEGATIVE
URINE GLUCOSE: NEGATIVE
UROBILINOGEN UA: 0.2 (ref 0.0–1.0)
pH: 5.5 (ref 5.0–8.0)

## 2017-11-07 LAB — HEMOGLOBIN A1C: Hgb A1c MFr Bld: 6.8 % — ABNORMAL HIGH (ref 4.6–6.5)

## 2017-11-07 MED ORDER — "INSULIN SYRINGE-NEEDLE U-100 30G X 5/16"" 1 ML MISC": 3 refills | Status: DC

## 2017-11-07 MED ORDER — BUPROPION HCL ER (XL) 150 MG PO TB24: 150.0000 mg | ORAL_TABLET | Freq: Every day | ORAL | 1 refills | Status: DC

## 2017-11-07 MED ORDER — EXENATIDE ER 2 MG ~~LOC~~ PEN: 2.0000 mg | PEN_INJECTOR | SUBCUTANEOUS | 1 refills | Status: DC

## 2017-11-07 MED ORDER — ATORVASTATIN CALCIUM 20 MG PO TABS: 20.0000 mg | ORAL_TABLET | Freq: Every day | ORAL | 1 refills | Status: DC

## 2017-11-07 MED ORDER — INSULIN GLARGINE 100 UNIT/ML ~~LOC~~ SOLN: SUBCUTANEOUS | 1 refills | Status: DC

## 2017-11-07 MED ORDER — FLUOXETINE HCL 40 MG PO CAPS: 40.0000 mg | ORAL_CAPSULE | Freq: Every day | ORAL | 1 refills | Status: DC

## 2017-11-07 NOTE — Patient Instructions (Signed)
Living With Depression Everyone experiences occasional disappointment, sadness, and loss in their lives. When you are feeling down, blue, or sad for at least 2 weeks in a row, it may mean that you have depression. Depression can affect your thoughts and feelings, relationships, daily activities, and physical health. It is caused by changes in the way your brain functions. If you receive a diagnosis of depression, your health care provider will tell you which type of depression you have and what treatment options are available to you. If you are living with depression, there are ways to help you recover from it and also ways to prevent it from coming back. How to cope with lifestyle changes Coping with stress Stress is your body's reaction to life changes and events, both good and bad. Stressful situations may include:  Getting married.  The death of a spouse.  Losing a job.  Retiring.  Having a baby.  Stress can last just a few hours or it can be ongoing. Stress can play a major role in depression, so it is important to learn both how to cope with stress and how to think about it differently. Talk with your health care provider or a counselor if you would like to learn more about stress reduction. He or she may suggest some stress reduction techniques, such as:  Music therapy. This can include creating music or listening to music. Choose music that you enjoy and that inspires you.  Mindfulness-based meditation. This kind of meditation can be done while sitting or walking. It involves being aware of your normal breaths, rather than trying to control your breathing.  Centering prayer. This is a kind of meditation that involves focusing on a spiritual word or phrase. Choose a word, phrase, or sacred image that is meaningful to you and that brings you peace.  Deep breathing. To do this, expand your stomach and inhale slowly through your nose. Hold your breath for 3-5 seconds, then exhale  slowly, allowing your stomach muscles to relax.  Muscle relaxation. This involves intentionally tensing muscles then relaxing them.  Choose a stress reduction technique that fits your lifestyle and personality. Stress reduction techniques take time and practice to develop. Set aside 5-15 minutes a day to do them. Therapists can offer training in these techniques. The training may be covered by some insurance plans. Other things you can do to manage stress include:  Keeping a stress diary. This can help you learn what triggers your stress and ways to control your response.  Understanding what your limits are and saying no to requests or events that lead to a schedule that is too full.  Thinking about how you respond to certain situations. You may not be able to control everything, but you can control how you react.  Adding humor to your life by watching funny films or TV shows.  Making time for activities that help you relax and not feeling guilty about spending your time this way.  Medicines Your health care provider may suggest certain medicines if he or she feels that they will help improve your condition. Avoid using alcohol and other substances that may prevent your medicines from working properly (may interact). It is also important to:  Talk with your pharmacist or health care provider about all the medicines that you take, their possible side effects, and what medicines are safe to take together.  Make it your goal to take part in all treatment decisions (shared decision-making). This includes giving input on the side   effects of medicines. It is best if shared decision-making with your health care provider is part of your total treatment plan.  If your health care provider prescribes a medicine, you may not notice the full benefits of it for 4-8 weeks. Most people who are treated for depression need to be on medicine for at least 6-12 months after they feel better. If you are taking  medicines as part of your treatment, do not stop taking medicines without first talking to your health care provider. You may need to have the medicine slowly decreased (tapered) over time to decrease the risk of harmful side effects. Relationships Your health care provider may suggest family therapy along with individual therapy and drug therapy. While there may not be family problems that are causing you to feel depressed, it is still important to make sure your family learns as much as they can about your mental health. Having your family's support can help make your treatment successful. How to recognize changes in your condition Everyone has a different response to treatment for depression. Recovery from major depression happens when you have not had signs of major depression for two months. This may mean that you will start to:  Have more interest in doing activities.  Feel less hopeless than you did 2 months ago.  Have more energy.  Overeat less often, or have better or improving appetite.  Have better concentration.  Your health care provider will work with you to decide the next steps in your recovery. It is also important to recognize when your condition is getting worse. Watch for these signs:  Having fatigue or low energy.  Eating too much or too little.  Sleeping too much or too little.  Feeling restless, agitated, or hopeless.  Having trouble concentrating or making decisions.  Having unexplained physical complaints.  Feeling irritable, angry, or aggressive.  Get help as soon as you or your family members notice these symptoms coming back. How to get support and help from others How to talk with friends and family members about your condition Talking to friends and family members about your condition can provide you with one way to get support and guidance. Reach out to trusted friends or family members, explain your symptoms to them, and let them know that you are  working with a health care provider to treat your depression. Financial resources Not all insurance plans cover mental health care, so it is important to check with your insurance carrier. If paying for co-pays or counseling services is a problem, search for a local or county mental health care center. They may be able to offer public mental health care services at low or no cost when you are not able to see a private health care provider. If you are taking medicine for depression, you may be able to get the generic form, which may be less expensive. Some makers of prescription medicines also offer help to patients who cannot afford the medicines they need. Follow these instructions at home:  Get the right amount and quality of sleep.  Cut down on using caffeine, tobacco, alcohol, and other potentially harmful substances.  Try to exercise, such as walking or lifting small weights.  Take over-the-counter and prescription medicines only as told by your health care provider.  Eat a healthy diet that includes plenty of vegetables, fruits, whole grains, low-fat dairy products, and lean protein. Do not eat a lot of foods that are high in solid fats, added sugars, or salt.    Keep all follow-up visits as told by your health care provider. This is important. Contact a health care provider if:  You stop taking your antidepressant medicines, and you have any of these symptoms: ? Nausea. ? Headache. ? Feeling lightheaded. ? Chills and body aches. ? Not being able to sleep (insomnia).  You or your friends and family think your depression is getting worse. Get help right away if:  You have thoughts of hurting yourself or others. If you ever feel like you may hurt yourself or others, or have thoughts about taking your own life, get help right away. You can go to your nearest emergency department or call:  Your local emergency services (911 in the [place]  A suicide crisis helpline, such as the  National Suicide Prevention Lifeline at (743)346-9501. This is open 24-hours a day.  Summary  If you are living with depression, there are ways to help you recover from it and also ways to prevent it from coming back.  Work with your health care team to create a management plan that includes counseling, stress management techniques, and healthy lifestyle habits. This information is not intended to replace advice given to you by your health care provider. Make sure you discuss any questions you have with your health care provider. Document Released: 02/01/2016 Document Revised: 02/01/2016 Document Reviewed: 02/01/2016 Elsevier Interactive Patient Education  2018 [employer].  Dehydration Dehydration is a condition in which there is not enough fluid or water in the body. This happens when you lose more fluids than you take in. Important organs, such as the kidneys, brain, and heart, cannot function without a proper amount of fluids. Any loss of fluids from the body can lead to dehydration. People age 37 or older have a higher risk of dehydration than younger adults because in older age, the body:  Is less able to conserve water.  Does not respond to temperature changes as well.  Does not get thirsty as easily or quickly.  Dehydration can range from mild to severe. This condition should be treated right away to prevent it from becoming severe. What are the causes? Dehydration may be caused by:  Vomiting.  Diarrhea.  Excessive sweating, such as from heat exposure or exercise.  Not drinking enough fluid, especially: ? When ill. ? While doing activity that requires a lot of energy.  Excessive urination.  Fever.  Infection.  Certain medicines, such as medicines that cause the body to lose excess fluid (diuretics).  Inability to access safe drinking water.  Poorly controlled blood sugars.  Reduced physical ability to get adequate water and food.  What increases the  risk? This condition is more likely to develop in people who:  Have a long-term (chronic) illness, such as: ? An illness that may increase urination, such as diabetes. ? Kidney, heart, or lung disease. ? Neurological or psychological disorders, such as dementia.  Are age 51 or older.  Are disabled.  Live in a place with high altitude.  What are the signs or symptoms? Symptoms of mild dehydration may include:  Thirst.  Dry lips.  Slightly dry mouth.  Dry, warm skin.  Dizziness. Symptoms of moderate dehydration may include:  Very dry mouth.  Muscle cramps.  Dark urine. Urine may be the color of tea.  Decreased urine production.  Decreased tear production.  Heartbeat that is irregular or faster than normal (palpitations).  Headache.  Light-headedness, especially when you stand up from a sitting position.  Fainting (syncope). Symptoms of severe  dehydration may include:  Changes in skin, such as: ? Cold and clammy skin. ? Blotchy (mottled) or pale skin. ? Skin that does not quickly return to normal after being lightly pinched and released (poor skin turgor).  Changes in body fluids, such as: ? Extreme thirst. ? No tear production. ? Inability to sweat when body temperature is high, such as in hot weather. ? Very little urine production.  Changes in vital signs, such as: ? Weak pulse. ? Pulse that is more than 100 beats a minute when sitting still. ? Rapid breathing. ? Low blood pressure.  Other changes, such as: ? Sunken eyes. ? Cold hands and feet. ? Confusion. ? Lack of energy (lethargy). ? Difficulty waking up from sleep. ? Short-term weight loss. ? Unconsciousness. How is this diagnosed? This condition is diagnosed based on your symptoms and a physical exam. Blood and urine tests may be done to help confirm the diagnosis. How is this treated? Treatment for this condition depends on the severity. Mild or moderate dehydration can often be  treated at home. Treatment should be started right away. Do not wait until dehydration becomes severe. Severe dehydration is an emergency and it needs to be treated in a hospital. Treatment for mild dehydration may include:  Drinking more fluids.  Replacing salts and minerals in your blood (electrolytes). Treatment for moderate dehydration may include:  Drinking an oral rehydration solution (ORS). This is a drink that helps you replace fluids and electrolytes (rehydrate). It is found at pharmacies and retail stores. Treatment for severe dehydration may include:  Receiving fluids through an IV tube.  Receiving an electrolyte solution through a tube passed through your nose and into your stomach (nasogastric tube or NG tube).  Correcting abnormalities in electrolytes.  Treating the underlying cause of dehydration. Follow these instructions at home:  If directed by your health care provider, drink an ORS: ? Make an ORS by following instructions on the package. ? Start by drinking small amounts, about  cup (120 mL) every 5-10 minutes. ? Slowly increase how much you drink until you have taken the amount recommended by your health care provider.  Drink enough clear fluid to keep your urine clear or pale yellow. If you were told to drink an ORS, finish the ORS first, then start slowly drinking other clear fluids. Drink fluids such as: ? Water. Do not drink only water. Doing that can lead to having too little salt (sodium) in the body (hyponatremia). ? Ice chips. ? Fruit juice that you have added water to (diluted fruit juice). ? Low-calorie sports drinks.  Avoid: ? Alcohol. ? Drinks that contain a lot of sugar. These include high-calorie sports drinks, fruit juice that is not diluted, and soda. ? Caffeine. ? Foods that are greasy or contain a lot of fat or sugar.  Take over-the-counter and prescription medicines only as told by your health care provider.  Do not take sodium tablets.  This can lead to having too much sodium in the body (hypernatremia).  Eat foods that contain a healthy balance of electrolytes, such as bananas, oranges, potatoes, tomatoes, and spinach.  Keep all follow-up visits as told by your health care provider. This is important. Contact a health care provider if:  You have abdominal pain that: ? Gets worse. ? Stays in one area (localizes).  You have a rash.  You have a stiff neck.  You are sleepier, more irritable, or more difficult to wake up than usual.  You feel weak,  dizzy, or very thirsty. Get help right away if:  You have: ? Symptoms of severe dehydration. ? A fever. ? A severe headache. ? Vomiting or diarrhea that gets worse or does not go away. ? Diarrhea for more than 24 hours. ? Blood or green matter (bile) in your vomit. ? Blood in your stool. This may cause stool to look black and tarry. ? Trouble breathing.  You cannot drink fluids without vomiting.  Your symptoms get worse with treatment.  You have not urinated in 6-8 hours.  You have urinated only a small amount of very dark urine over 6-8 hours.  You faint.  Your heart rate while sitting still is over 100 beats a minute. This information is not intended to replace advice given to you by your health care provider. Make sure you discuss any questions you have with your health care provider. Document Released: 05/21/2003 Document Revised: 09/18/2015 Document Reviewed: 04/24/2015 Elsevier Interactive Patient Education  2018 [employer].

## 2017-11-07 NOTE — Progress Notes (Addendum)
Subjective:  Patient ID: Sierra Hines, female    DOB: 1945/12/26  Age: 72 y.o. MRN: [id number]  CC: Follow-up   HPI Sierra Hines presents for patient continues to take all of her medicines without issue.  Diabetes is been under good control and patient is having no problems taking her insulin nightly.  Her usual dose is 50 units of Lantus.  LDL cholesterol has been well controlled with atorvastatin.  She feels dehydrated status post recent gastroenteritis that his resolved.  She is feels a little fatigued and rundown from that illness.  Tells me that her depression is well controlled with the Prozac.  Outpatient Medications Prior to Visit  Medication Sig Dispense Refill  . ACCU-CHEK AVIVA PLUS test strip Use to test blood sugar 1-2 times per day.  3  . aspirin EC 81 MG tablet Take 1 tablet (81 mg total) daily by mouth. 365 tablet 0  . Calcium 500 MG tablet Take one tablet twice daily. 60 tablet 3  . fexofenadine (ALLEGRA) 180 MG tablet Take 1 tablet (180 mg total) daily by mouth. Take 1 tablet by mouth daily. 180 tablet 1  . lisinopril-hydrochlorothiazide (PRINZIDE,ZESTORETIC) 20-25 MG tablet Take 1 tablet by mouth daily. 90 tablet 3  . Vitamin D, Ergocalciferol, (DRISDOL) 50000 units CAPS capsule Take 1 capsule by mouth once a week.    . Exenatide ER 2 MG PEN Inject 2 mg into the skin once a week. 12 each 1  . FLUoxetine (PROZAC) 40 MG capsule Take 1 capsule (40 mg total) by mouth daily. Take 1 capsule by mouth daily. 90 capsule 1  . insulin glargine (LANTUS) 100 UNIT/ML injection May use up to 60 Units nightly as directed 60 mL 1  . Insulin Syringe-Needle U-100 30G X 5/16" 1 ML MISC To use with insulin. 100 each 3  . atorvastatin (LIPITOR) 20 MG tablet Take 1 tablet (20 mg total) by mouth daily for 1 dose. 90 tablet 1   No facility-administered medications prior to visit.     ROS Review of Systems  Constitutional: Positive for fatigue. Negative for chills, diaphoresis, fever and  unexpected weight change.  HENT: Negative.   Eyes: Negative.   Respiratory: Negative.   Cardiovascular: Negative.   Gastrointestinal: Negative.   Endocrine: Negative for polyphagia and polyuria.  Genitourinary: Negative for difficulty urinating, frequency and vaginal pain.  Musculoskeletal: Negative for gait problem and joint swelling.  Skin: Negative for pallor and rash.  Allergic/Immunologic: Negative for immunocompromised state.  Neurological: Positive for light-headedness. Negative for headaches.  Hematological: Does not bruise/bleed easily.  Psychiatric/Behavioral: Positive for dysphoric mood.    Objective:  BP 118/60   Pulse (!) 105   Ht 5\' 4"  (1.626 m)   Wt 230 lb 6 oz (104.5 kg)   SpO2 97%   BMI 39.54 kg/m   BP Readings from Last 3 Encounters:  11/07/17 118/60  07/31/17 130/80  05/02/17 120/70    Wt Readings from Last 3 Encounters:  11/07/17 230 lb 6 oz (104.5 kg)  07/31/17 233 lb 6 oz (105.9 kg)  05/02/17 233 lb 8 oz (105.9 kg)    Physical Exam  Constitutional: She is oriented to person, place, and time. She appears well-developed and well-nourished. No distress.  HENT:  Head: Normocephalic and atraumatic.  Right Ear: External ear normal.  Left Ear: External ear normal.  Mouth/Throat: Oropharynx is clear and moist.    Eyes: Pupils are equal, round, and reactive to light. Conjunctivae and EOM are normal. Right eye  exhibits no discharge. Left eye exhibits no discharge. No scleral icterus.  Neck: Neck supple. No JVD present. No tracheal deviation present. No thyromegaly present.  Cardiovascular: Normal rate, regular rhythm and normal heart sounds.  Pulmonary/Chest: Effort normal and breath sounds normal.  Musculoskeletal: She exhibits no edema.  Lymphadenopathy:    She has no cervical adenopathy.  Neurological: She is alert and oriented to person, place, and time.  Skin: Skin is warm and dry. Capillary refill takes less than 2 seconds. She is not  diaphoretic.     Psychiatric: She has a normal mood and affect. Her behavior is normal.   Depression screen [facility] 2/9 11/07/2017 01/30/2017  Decreased Interest 2 0  Down, Depressed, Hopeless 2 0  PHQ - 2 Score 4 0  Altered sleeping 3 -  Tired, decreased energy 3 -  Change in appetite 1 -  Feeling bad or failure about yourself  1 -  Trouble concentrating 1 -  Moving slowly or fidgety/restless 0 -  Suicidal thoughts 0 -  PHQ-9 Score 13 -     Lab Results  Component Value Date   WBC 9.1 11/07/2017   HGB 11.8 (L) 11/07/2017   HCT 35.5 (L) 11/07/2017   PLT 315.0 11/07/2017   GLUCOSE 102 (H) 11/07/2017   CHOL 105 11/07/2017   TRIG 72.0 11/07/2017   HDL 55.20 11/07/2017   LDLCALC 36 11/07/2017   ALT 16 01/30/2017   AST 17 01/30/2017   NA 134 (L) 11/07/2017   K 4.4 11/07/2017   CL 99 11/07/2017   CREATININE 1.67 (H) 11/07/2017   BUN 33 (H) 11/07/2017   CO2 25 11/07/2017   TSH 3.21 01/30/2017   HGBA1C 6.8 (H) 11/07/2017   MICROALBUR 51.5 (H) 07/31/2017    Dg Bone Density  Result Date: 05/09/2017 EXAM: DUAL X-RAY ABSORPTIOMETRY (DXA) FOR BONE MINERAL DENSITY IMPRESSION: Referring Physician:  Talmadge Coventry [facility] PATIENT: Name: Sierra Hines, Limb Patient ID: [id number] Birth Date: 10/09/1945 Height: 63.0 in. Sex: Female Measured: 05/09/2017 Weight: 232.8 lbs. Indications: Advanced Age, Caucasian, Diabetic, Estrogen Deficiency, Low Calcium Intake, Parent hip Fx, Post Menopausal, Previous Tobacco User, Family Hist. (Parent hip fracture), History of Fracture (Adult) Fractures: Forearm Treatments: Bidurion, Lantis ASSESSMENT: The BMD measured at Femur Neck Left is 0.829 g/cm2 with a T-score of -1.5. This patient is considered osteopenic according to World Health Organization [facility]) criteria. Site Region Measured Date Measured Age WHO YA BMD Classification T-score AP Spine L1-L4 05/09/2017 71.7 Normal 0.7 1.264 g/cm2 DualFemur Neck Left 05/09/2017 71.7 years Low Bone Mass -1.5 0.829 g/cm2 World  Health Organization [facility]) criteria for post-menopausal, Caucasian Women: Normal        T-score at or above -1 SD Low Bone Mass T-score between -1 and -2.5 SD Osteoporosis  T-score at or below -2.5 SD RECOMMENDATION: National Osteoporosis Foundation recommends that FDA-approved medical therapies be considered in postmenopausal women and men age 69 or older with a: 1. Hip or vertebral (clinical or morphometric) fracture. 2. T-score of < -2.5 at the spine or hip. 3. Ten-year fracture probability by FRAX of 3% or greater for hip fracture or 20% or greater for major osteoporotic fracture. All treatment decisions require clinical judgment and consideration of individual patient factors, including patient preferences, co-morbidities, previous drug use, risk factors not captured in the FRAX model (e.g. falls, vitamin D deficiency, increased bone turnover, interval significant decline in bone density) and possible under - or over-estimation of fracture risk by FRAX. All patients should ensure an adequate intake of dietary  calcium (1200 mg/d) and vitamin D (800 IU daily) unless contraindicated. FOLLOW-UP: People with diagnosed cases of osteoporosis or at high risk for fracture should have regular bone mineral density tests. For patients eligible for Medicare, routine testing is allowed once every 2 years. The testing frequency can be increased to one year for patients who have rapidly progressing disease, those who are receiving or discontinuing medical therapy to restore bone mass, or have additional risk factors. I have reviewed this report and agree with the above findings. [facility] Patient: Carlyle LipaHelser, Hollin   Referring Physician: Talmadge CoventryWILLIAM ALFRED [facility] Birth Date: 1945-11-18 [age] Patient ID: [id number] Height: 63.0 in. Weight: 232.8 lbs. Measured: 05/09/2017 2:22:06 PM (16 SP 2) Sex: Female Ethnicity: White Analyzed: 05/09/2017 2:24:53 PM (16 SP 2) FRAX* 10-year Probability of Fracture Based on  femoral neck BMD: DualFemur (Left) Major Osteoporotic Fracture: 21.7% Hip Fracture:                5.4% Population:                  [place] (Caucasian) Risk Factors: Family Hist. (Parent hip fracture), History of Fracture (Adult) *FRAX is a Armed forces logistics/support/administrative officertrademark of the [employer] for Metabolic Bone Disease, a World Science writerHealth Organization (WHO) [employer]. ASSESSMENT: The probability of a major osteoporotic fracture is 21.7% within the next ten years. The probability of a hip fracture is 5.4% within the next ten years. Electronically Signed   By: Bretta BangWilliam  Woodruff III M.D.   On: 05/09/2017 14:52    Assessment & Plan:   [name] was seen today for follow-up.  Diagnoses and all orders for this visit:  HTN (hypertension), benign -     Basic metabolic panel -     CBC -     Urinalysis, Routine w reflex microscopic  Type 2 diabetes mellitus with stage 3 chronic kidney disease, with long-term current use of insulin (HCC) -     Exenatide ER 2 MG PEN; Inject 2 mg into the skin once a week. -     insulin glargine (LANTUS) 100 UNIT/ML injection; May use up to 60 Units nightly as directed -     Insulin Syringe-Needle U-100 30G X 5/16" 1 ML MISC; To use with insulin. -     Basic metabolic panel -     CBC -     Hemoglobin A1c -     Urinalysis, Routine w reflex microscopic  Depression, recurrent (HCC) -     FLUoxetine (PROZAC) 40 MG capsule; Take 1 capsule (40 mg total) by mouth daily. Take 1 capsule by mouth daily. -     buPROPion (WELLBUTRIN XL) 150 MG 24 hr tablet; Take 1 tablet (150 mg total) by mouth daily.  Elevated LDL cholesterol level -     atorvastatin (LIPITOR) 20 MG tablet; Take 1 tablet (20 mg total) by mouth daily for 1 dose. -     Lipid panel  Dehydration -     Basic metabolic panel -     CBC -     Urinalysis, Routine w reflex microscopic  Anemia, unspecified type -     Iron, TIBC and Ferritin Panel -     Retic   I am having Lupita Leashonna Gaetz start on  buPROPion. I am also having her maintain her fexofenadine, aspirin EC, Vitamin D (Ergocalciferol), lisinopril-hydrochlorothiazide, Calcium, ACCU-CHEK AVIVA PLUS, atorvastatin, Exenatide ER, FLUoxetine, insulin glargine, and Insulin Syringe-Needle U-100.  Meds ordered  this encounter  Medications  . atorvastatin (LIPITOR) 20 MG tablet    Sig: Take 1 tablet (20 mg total) by mouth daily for 1 dose.    Dispense:  90 tablet    Refill:  1  . Exenatide ER 2 MG PEN    Sig: Inject 2 mg into the skin once a week.    Dispense:  12 each    Refill:  1  . FLUoxetine (PROZAC) 40 MG capsule    Sig: Take 1 capsule (40 mg total) by mouth daily. Take 1 capsule by mouth daily.    Dispense:  90 capsule    Refill:  1  . insulin glargine (LANTUS) 100 UNIT/ML injection    Sig: May use up to 60 Units nightly as directed    Dispense:  60 mL    Refill:  1  . Insulin Syringe-Needle U-100 30G X 5/16" 1 ML MISC    Sig: To use with insulin.    Dispense:  100 each    Refill:  3  . buPROPion (WELLBUTRIN XL) 150 MG 24 hr tablet    Sig: Take 1 tablet (150 mg total) by mouth daily.    Dispense:  30 tablet    Refill:  1   Patient's blood pressure is lower than I would like to see you today.  She exhibits some signs of lingering dehydration.  She will make an effort to hydrate this and follow-up with me in 2 weeks.  Patient had a relatively high PHQ 9 score.  We discussed this and she said that these symptoms have been going on for quite some time now.  She is compliant with her Prozac 40 mg daily.  She is willing to try augmentation.  Patient received information on dehydration and living with depression.  Follow-up: Return in about 2 weeks (around 11/21/2017).  Mliss Sax, MD

## 2017-11-08 DIAGNOSIS — D509 Iron deficiency anemia, unspecified: Secondary | ICD-10-CM | POA: Insufficient documentation

## 2017-11-08 NOTE — Addendum Note (Signed)
Addended by: Andrez GrimeKREMER, WILLIAM A on: 11/08/2017 09:31 AM   Modules accepted: Orders

## 2017-11-21 ENCOUNTER — Encounter: Payer: Self-pay | Admitting: Family Medicine

## 2017-11-21 ENCOUNTER — Ambulatory Visit: Payer: Medicare Other | Admitting: Family Medicine

## 2017-11-21 VITALS — BP 140/80 | HR 115 | Temp 98.8°F | Ht 64.0 in | Wt 231.2 lb

## 2017-11-21 DIAGNOSIS — D649 Anemia, unspecified: Secondary | ICD-10-CM

## 2017-11-21 DIAGNOSIS — E860 Dehydration: Secondary | ICD-10-CM | POA: Diagnosis not present

## 2017-11-21 DIAGNOSIS — F339 Major depressive disorder, recurrent, unspecified: Secondary | ICD-10-CM

## 2017-11-21 LAB — BASIC METABOLIC PANEL
BUN: 27 mg/dL — AB (ref 6–23)
CALCIUM: 9.8 mg/dL (ref 8.4–10.5)
CO2: 26 meq/L (ref 19–32)
Chloride: 99 mEq/L (ref 96–112)
Creatinine, Ser: 1.59 mg/dL — ABNORMAL HIGH (ref 0.40–1.20)
GFR: 33.9 mL/min — ABNORMAL LOW (ref >60.00)
Glucose, Bld: 157 mg/dL — ABNORMAL HIGH (ref 70–99)
Potassium: 4.9 mEq/L (ref 3.5–5.1)
Sodium: 135 mEq/L (ref 135–145)

## 2017-11-21 LAB — URINALYSIS, ROUTINE W REFLEX MICROSCOPIC
HGB URINE DIPSTICK: NEGATIVE
Ketones, ur: NEGATIVE
Nitrite: NEGATIVE
RBC / HPF: NONE SEEN (ref 0–?)
Specific Gravity, Urine: 1.025 (ref 1.000–1.030)
TOTAL PROTEIN, URINE-UPE24: NEGATIVE
UROBILINOGEN UA: 0.2 (ref 0.0–1.0)
Urine Glucose: NEGATIVE
pH: 5.5 (ref 5.0–8.0)

## 2017-11-21 NOTE — Progress Notes (Signed)
Subjective:  Patient ID: Sierra Hines, female    DOB: Jul 25, 1945  Age: 72 y.o. MRN: [id number]  CC: Follow-up (HTN)   HPI Sierra Hines presents for follow-up of her depression.  She has been taking the Wellbutrin augmentation for a couple of weeks and feels as though there have been has been on lifting of her mood.  She is finally recovering from her gastroenteritis.  Her stools have firmed and she denies melena or blood in her stool.  There is been no hematuria as well.  She is feeling much better.  She feels as though her dehydration has passed.  And her blood pressure today reflects that.  She will try to give [place] a urine sample.  Discussed the fact that her hemoglobin fell by 2 points.  MCV is low.  She is not actively bleeding at any point that she knows of.  Will be checking her iron and reticulocyte counts today.  She will see her nephrologist in a couple of weeks.  Expressed my concern about her GFR.  Outpatient Medications Prior to Visit  Medication Sig Dispense Refill  . ACCU-CHEK AVIVA PLUS test strip Use to test blood sugar 1-2 times per day.  3  . aspirin EC 81 MG tablet Take 1 tablet (81 mg total) daily by mouth. 365 tablet 0  . buPROPion (WELLBUTRIN XL) 150 MG 24 hr tablet Take 1 tablet (150 mg total) by mouth daily. 30 tablet 1  . Calcium 500 MG tablet Take one tablet twice daily. 60 tablet 3  . Exenatide ER 2 MG PEN Inject 2 mg into the skin once a week. 12 each 1  . fexofenadine (ALLEGRA) 180 MG tablet Take 1 tablet (180 mg total) daily by mouth. Take 1 tablet by mouth daily. 180 tablet 1  . FLUoxetine (PROZAC) 40 MG capsule Take 1 capsule (40 mg total) by mouth daily. Take 1 capsule by mouth daily. 90 capsule 1  . insulin glargine (LANTUS) 100 UNIT/ML injection May use up to 60 Units nightly as directed 60 mL 1  . Insulin Syringe-Needle U-100 30G X 5/16" 1 ML MISC To use with insulin. 100 each 3  . lisinopril-hydrochlorothiazide (PRINZIDE,ZESTORETIC) 20-25 MG tablet Take 1  tablet by mouth daily. 90 tablet 3  . Vitamin D, Ergocalciferol, (DRISDOL) 50000 units CAPS capsule Take 1 capsule by mouth once a week.    Marland Kitchen atorvastatin (LIPITOR) 20 MG tablet Take 1 tablet (20 mg total) by mouth daily for 1 dose. 90 tablet 1   No facility-administered medications prior to visit.     ROS Review of Systems  Constitutional: Negative for chills, fever and unexpected weight change.  Eyes: Negative for photophobia and visual disturbance.  Respiratory: Negative.  Negative for chest tightness and shortness of breath.   Cardiovascular: Negative for chest pain and palpitations.  Gastrointestinal: Negative for abdominal pain, anal bleeding, blood in stool, constipation and diarrhea.  Genitourinary: Negative for hematuria.  Musculoskeletal: Negative for gait problem and joint swelling.  Skin: Negative for pallor and rash.  Neurological: Negative for numbness and headaches.  Hematological: Does not bruise/bleed easily.  Psychiatric/Behavioral: Negative.     Objective:  BP 140/80 (BP Location: Left Arm, Patient Position: Sitting, Cuff Size: Large)   Pulse (!) 115   Temp 98.8 F (37.1 C) (Oral)   Ht 5\' 4"  (1.626 m)   Wt 231 lb 3.2 oz (104.9 kg)   SpO2 96%   BMI 39.69 kg/m   BP Readings from Last 3 Encounters:  11/21/17 140/80  11/07/17 118/60  07/31/17 130/80    Wt Readings from Last 3 Encounters:  11/21/17 231 lb 3.2 oz (104.9 kg)  11/07/17 230 lb 6 oz (104.5 kg)  07/31/17 233 lb 6 oz (105.9 kg)    Physical Exam  Constitutional: She is oriented to person, place, and time. She appears well-developed and well-nourished. No distress.  HENT:  Head: Normocephalic and atraumatic.  Right Ear: External ear normal.  Left Ear: External ear normal.  Eyes: Right eye exhibits no discharge. Left eye exhibits no discharge. No scleral icterus.  Pulmonary/Chest: Effort normal.  Neurological: She is alert and oriented to person, place, and time.  Skin: Skin is warm and dry.  She is not diaphoretic.  Psychiatric: She has a normal mood and affect. Her behavior is normal.    Lab Results  Component Value Date   WBC 9.1 11/07/2017   HGB 11.8 (L) 11/07/2017   HCT 35.5 (L) 11/07/2017   PLT 315.0 11/07/2017   GLUCOSE 102 (H) 11/07/2017   CHOL 105 11/07/2017   TRIG 72.0 11/07/2017   HDL 55.20 11/07/2017   LDLCALC 36 11/07/2017   ALT 16 01/30/2017   AST 17 01/30/2017   NA 134 (L) 11/07/2017   K 4.4 11/07/2017   CL 99 11/07/2017   CREATININE 1.67 (H) 11/07/2017   BUN 33 (H) 11/07/2017   CO2 25 11/07/2017   TSH 3.21 01/30/2017   HGBA1C 6.8 (H) 11/07/2017   MICROALBUR 51.5 (H) 07/31/2017    Dg Bone Density  Result Date: 05/09/2017 EXAM: DUAL X-RAY ABSORPTIOMETRY (DXA) FOR BONE MINERAL DENSITY IMPRESSION: Referring Physician:  Talmadge Coventry [facility] PATIENT: Name: Sierra Hines, Sierra Hines Patient ID: [id number] Birth Date: [birth year] Height: 63.0 in. Sex: Female Measured: 05/09/2017 Weight: 232.8 lbs. Indications: Advanced Age, Caucasian, Diabetic, Estrogen Deficiency, Low Calcium Intake, Parent hip Fx, Post Menopausal, Previous Tobacco User, Family Hist. (Parent hip fracture), History of Fracture (Adult) Fractures: Forearm Treatments: Bidurion, Lantis ASSESSMENT: The BMD measured at Femur Neck Left is 0.829 g/cm2 with a T-score of -1.5. This patient is considered osteopenic according to World Health Organization [facility]) criteria. Site Region Measured Date Measured Age WHO YA BMD Classification T-score AP Spine L1-L4 05/09/2017 71.7 Normal 0.7 1.264 g/cm2 DualFemur Neck Left 05/09/2017 71.7 years Low Bone Mass -1.5 0.829 g/cm2 World Health Organization [facility]) criteria for post-menopausal, Caucasian Women: Normal        T-score at or above -1 SD Low Bone Mass T-score between -1 and -2.5 SD Osteoporosis  T-score at or below -2.5 SD RECOMMENDATION: National Osteoporosis Foundation recommends that FDA-approved medical therapies be considered in postmenopausal women and men age 44 or older with  a: 1. Hip or vertebral (clinical or morphometric) fracture. 2. T-score of < -2.5 at the spine or hip. 3. Ten-year fracture probability by FRAX of 3% or greater for hip fracture or 20% or greater for major osteoporotic fracture. All treatment decisions require clinical judgment and consideration of individual patient factors, including patient preferences, co-morbidities, previous drug use, risk factors not captured in the FRAX model (e.g. falls, vitamin D deficiency, increased bone turnover, interval significant decline in bone density) and possible under - or over-estimation of fracture risk by FRAX. All patients should ensure an adequate intake of dietary calcium (1200 mg/d) and vitamin D (800 IU daily) unless contraindicated. FOLLOW-UP: People with diagnosed cases of osteoporosis or at high risk for fracture should have regular bone mineral density tests. For patients eligible for Medicare, routine testing is allowed once every 2 years.  The testing frequency can be increased to one year for patients who have rapidly progressing disease, those who are receiving or discontinuing medical therapy to restore bone mass, or have additional risk factors. I have reviewed this report and agree with the above findings. [facility] Patient: Sierra Hines   Referring Physician: Talmadge Coventry [facility] Birth Date: [birth year] [age] Patient ID: [id number] Height: 63.0 in. Weight: 232.8 lbs. Measured: 05/09/2017 2:22:06 PM (16 SP 2) Sex: Female Ethnicity: White Analyzed: 05/09/2017 2:24:53 PM (16 SP 2) FRAX* 10-year Probability of Fracture Based on femoral neck BMD: DualFemur (Left) Major Osteoporotic Fracture: 21.7% Hip Fracture:                5.4% Population:                  [place] (Caucasian) Risk Factors: Family Hist. (Parent hip fracture), History of Fracture (Adult) *FRAX is a Armed forces logistics/support/administrative officer of the [employer] for Metabolic Bone Disease, a World Science writer (WHO)  [employer]. ASSESSMENT: The probability of a major osteoporotic fracture is 21.7% within the next ten years. The probability of a hip fracture is 5.4% within the next ten years. Electronically Signed   By: Bretta Bang III M.D.   On: 05/09/2017 14:52    Assessment & Plan:   Myrtis was seen today for follow-up.  Diagnoses and all orders for this visit:  Depression, recurrent (HCC)  Dehydration -     Basic metabolic panel -     Urinalysis, Routine w reflex microscopic  Anemia, unspecified type   I am having Sierra Hines maintain her fexofenadine, aspirin EC, Vitamin D (Ergocalciferol), lisinopril-hydrochlorothiazide, Calcium, ACCU-CHEK AVIVA PLUS, atorvastatin, Exenatide ER, FLUoxetine, insulin glargine, Insulin Syringe-Needle U-100, and buPROPion.  No orders of the defined types were placed in this encounter.  Iron and reticulocyte levels are pending.  We will recheck BMP and urine today.  Patient will follow-up with nephrology also on the 23rd.  We will continue Wellbutrin augmentation with the Prozac.  She will follow-up in 1 month for me.  Encouraged hydration and she told me that she is absolutely doing the best that she can and I believe her.  Follow-up: Return in about 1 month (around 12/21/2017), or if symptoms worsen or fail to improve.  Mliss Sax, MD

## 2017-12-21 ENCOUNTER — Ambulatory Visit: Payer: Medicare Other | Admitting: Family Medicine

## 2017-12-27 ENCOUNTER — Other Ambulatory Visit: Payer: Self-pay | Admitting: Family Medicine

## 2017-12-27 DIAGNOSIS — F339 Major depressive disorder, recurrent, unspecified: Secondary | ICD-10-CM

## 2018-01-01 ENCOUNTER — Encounter: Payer: Self-pay | Admitting: Family Medicine

## 2018-01-01 ENCOUNTER — Ambulatory Visit: Payer: Medicare Other | Admitting: Family Medicine

## 2018-01-01 VITALS — BP 126/70 | Ht 64.0 in | Wt 225.2 lb

## 2018-01-01 DIAGNOSIS — F339 Major depressive disorder, recurrent, unspecified: Secondary | ICD-10-CM

## 2018-01-01 DIAGNOSIS — Z23 Encounter for immunization: Secondary | ICD-10-CM | POA: Diagnosis not present

## 2018-01-01 DIAGNOSIS — D509 Iron deficiency anemia, unspecified: Secondary | ICD-10-CM

## 2018-01-01 DIAGNOSIS — N183 Chronic kidney disease, stage 3 unspecified: Secondary | ICD-10-CM | POA: Insufficient documentation

## 2018-01-01 DIAGNOSIS — E860 Dehydration: Secondary | ICD-10-CM | POA: Diagnosis not present

## 2018-01-01 DIAGNOSIS — I10 Essential (primary) hypertension: Secondary | ICD-10-CM

## 2018-01-01 LAB — URINALYSIS, ROUTINE W REFLEX MICROSCOPIC
Hgb urine dipstick: NEGATIVE
Ketones, ur: NEGATIVE
Nitrite: NEGATIVE
PH: 5.5 (ref 5.0–8.0)
RBC / HPF: NONE SEEN (ref 0–?)
Specific Gravity, Urine: 1.02 (ref 1.000–1.030)
TOTAL PROTEIN, URINE-UPE24: NEGATIVE
UROBILINOGEN UA: 0.2 (ref 0.0–1.0)
Urine Glucose: NEGATIVE

## 2018-01-01 LAB — BASIC METABOLIC PANEL
BUN: 25 mg/dL — ABNORMAL HIGH (ref 6–23)
CALCIUM: 10.2 mg/dL (ref 8.4–10.5)
CO2: 27 mEq/L (ref 19–32)
CREATININE: 1.71 mg/dL — AB (ref 0.40–1.20)
Chloride: 96 mEq/L (ref 96–112)
GFR: 31.16 mL/min — AB (ref >60.00)
Glucose, Bld: 118 mg/dL — ABNORMAL HIGH (ref 70–99)
Potassium: 4.5 mEq/L (ref 3.5–5.1)
SODIUM: 133 meq/L — AB (ref 135–145)

## 2018-01-01 MED ORDER — BUPROPION HCL ER (XL) 150 MG PO TB24: 150.0000 mg | ORAL_TABLET | Freq: Every day | ORAL | 1 refills | Status: DC

## 2018-01-01 MED ORDER — FLUOXETINE HCL 40 MG PO CAPS: 40.0000 mg | ORAL_CAPSULE | Freq: Every day | ORAL | 1 refills | Status: DC

## 2018-01-01 MED ORDER — LISINOPRIL 10 MG PO TABS: 10.0000 mg | ORAL_TABLET | Freq: Every day | ORAL | 3 refills | Status: DC

## 2018-01-01 NOTE — Progress Notes (Addendum)
Subjective:  Patient ID: Sierra Hines, female    DOB: [birth year]  Age: 72 y.o. MRN: [id number]  CC: Follow-up   HPI Sierra Hines presents for patient presents for follow-up of her hypertension, depression chronic kidney disease dehydration and microcytic anemia.  Nephrology after current lisinopril/HCTZ dosage.  Her blood pressure continues to be well controlled on half the dose.  She feels as though she continues to struggle with fluid intake.  She will follow-up with nephrology next month.  She is checking her blood pressures daily.  Feels as though her depression has improved with augmentation with Wellbutrin.  She will have an iron panel with a reticulocyte count drawn with today's labs.  Nephrology has started high-dose vitamin D for her.  Outpatient Medications Prior to Visit  Medication Sig Dispense Refill  . ACCU-CHEK AVIVA PLUS test strip Use to test blood sugar 1-2 times per day.  3  . aspirin EC 81 MG tablet Take 1 tablet (81 mg total) daily by mouth. 365 tablet 0  . Calcium 500 MG tablet Take one tablet twice daily. 60 tablet 3  . Exenatide ER 2 MG PEN Inject 2 mg into the skin once a week. 12 each 1  . fexofenadine (ALLEGRA) 180 MG tablet Take 1 tablet (180 mg total) daily by mouth. Take 1 tablet by mouth daily. 180 tablet 1  . insulin glargine (LANTUS) 100 UNIT/ML injection May use up to 60 Units nightly as directed 60 mL 1  . Insulin Syringe-Needle U-100 30G X 5/16" 1 ML MISC To use with insulin. 100 each 3  . Vitamin D, Ergocalciferol, (DRISDOL) 50000 units CAPS capsule Take 1 capsule by mouth once a week.    Marland Kitchen buPROPion (WELLBUTRIN XL) 150 MG 24 hr tablet TAKE 1 TABLET(150 MG) BY MOUTH DAILY 30 tablet 0  . FLUoxetine (PROZAC) 40 MG capsule Take 1 capsule (40 mg total) by mouth daily. Take 1 capsule by mouth daily. 90 capsule 1  . lisinopril-hydrochlorothiazide (PRINZIDE,ZESTORETIC) 10-12.5 MG tablet Take 1 tablet by mouth daily.  3  . atorvastatin (LIPITOR) 20 MG tablet Take  1 tablet (20 mg total) by mouth daily for 1 dose. 90 tablet 1  . lisinopril-hydrochlorothiazide (PRINZIDE,ZESTORETIC) 20-25 MG tablet Take 1 tablet by mouth daily. 90 tablet 3   No facility-administered medications prior to visit.     ROS Review of Systems  Constitutional: Negative.   HENT: Negative.   Eyes: Negative.   Respiratory: Negative.   Cardiovascular: Negative.   Gastrointestinal: Negative.   Genitourinary: Negative for decreased urine volume, difficulty urinating and frequency.  Musculoskeletal: Negative for joint swelling.  Skin: Negative for pallor and rash.  Allergic/Immunologic: Negative for immunocompromised state.  Neurological: Negative for weakness and light-headedness.  Hematological: Does not bruise/bleed easily.  Psychiatric/Behavioral: Negative.    Depression screen [facility] 2/9 01/01/2018 11/07/2017 01/30/2017  Decreased Interest 1 2 0  Down, Depressed, Hopeless 1 2 0  PHQ - 2 Score 2 4 0  Altered sleeping 1 3 -  Tired, decreased energy 1 3 -  Change in appetite 0 1 -  Feeling bad or failure about yourself  1 1 -  Trouble concentrating 0 1 -  Moving slowly or fidgety/restless 0 0 -  Suicidal thoughts 0 0 -  PHQ-9 Score 5 13 -    Objective:  BP 126/70   Ht 5\' 4"  (1.626 m)   Wt 225 lb 4 oz (102.2 kg)   BMI 38.66 kg/m   BP Readings from Last 3 Encounters:  01/01/18 126/70  11/21/17 140/80  11/07/17 118/60    Wt Readings from Last 3 Encounters:  01/01/18 225 lb 4 oz (102.2 kg)  11/21/17 231 lb 3.2 oz (104.9 kg)  11/07/17 230 lb 6 oz (104.5 kg)    Physical Exam  Constitutional: She is oriented to person, place, and time. She appears well-developed and well-nourished. No distress.  HENT:  Head: Normocephalic and atraumatic.  Right Ear: External ear normal.  Left Ear: External ear normal.  Eyes: Pupils are equal, round, and reactive to light. Conjunctivae and EOM are normal.  Neck: No JVD present. No tracheal deviation present.  Cardiovascular:  Normal rate, regular rhythm and normal heart sounds.  Pulmonary/Chest: Effort normal and breath sounds normal.  Abdominal: Bowel sounds are normal.  Neurological: She is alert and oriented to person, place, and time.  Skin: Skin is warm and dry. She is not diaphoretic.  Psychiatric: She has a normal mood and affect. Her behavior is normal.    Lab Results  Component Value Date   WBC 9.1 11/07/2017   HGB 11.8 (L) 11/07/2017   HCT 35.5 (L) 11/07/2017   PLT 315.0 11/07/2017   GLUCOSE 118 (H) 01/01/2018   CHOL 105 11/07/2017   TRIG 72.0 11/07/2017   HDL 55.20 11/07/2017   LDLCALC 36 11/07/2017   ALT 16 01/30/2017   AST 17 01/30/2017   NA 133 (L) 01/01/2018   K 4.5 01/01/2018   CL 96 01/01/2018   CREATININE 1.71 (H) 01/01/2018   BUN 25 (H) 01/01/2018   CO2 27 01/01/2018   TSH 3.21 01/30/2017   HGBA1C 6.8 (H) 11/07/2017   MICROALBUR 51.5 (H) 07/31/2017    Dg Bone Density  Result Date: 05/09/2017 EXAM: DUAL X-RAY ABSORPTIOMETRY (DXA) FOR BONE MINERAL DENSITY IMPRESSION: Referring Physician:  Talmadge Coventry [facility] PATIENT: Name: Sierra Hines, Sierra Hines Patient ID: [id number] Birth Date: 1945/06/26 Height: 63.0 in. Sex: Female Measured: 05/09/2017 Weight: 232.8 lbs. Indications: Advanced Age, Caucasian, Diabetic, Estrogen Deficiency, Low Calcium Intake, Parent hip Fx, Post Menopausal, Previous Tobacco User, Family Hist. (Parent hip fracture), History of Fracture (Adult) Fractures: Forearm Treatments: [place], [place] ASSESSMENT: The BMD measured at Femur Neck Left is 0.829 g/cm2 with a T-score of -1.5. This patient is considered osteopenic according to World Health Organization [facility]) criteria. Site Region Measured Date Measured Age WHO YA BMD Classification T-score AP Spine L1-L4 05/09/2017 71.7 Normal 0.7 1.264 g/cm2 DualFemur Neck Left 05[age]09/2017 71.7 years Low Bone Mass -1.5 0.829 g/cm2 World Health Organization [facility]) criteria for post-menopausal, Caucasian Women: Normal        T-score at or above -1 SD  Low Bone Mass T-score between -1 and -2.5 SD Osteoporosis  T-score at or below -2.5 SD RECOMMENDATION: National Osteoporosis Foundation recommends that FDA-approved medical therapies be considered in postmenopausal women and men age 53 or older with a: 1. Hip or vertebral (clinical or morphometric) fracture. 2. T-score of < -2.5 at the spine or hip. 3. Ten-year fracture probability by FRAX of 3% or greater for hip fracture or 20% or greater for major osteoporotic fracture. All treatment decisions require clinical judgment and consideration of individual patient factors, including patient preferences, co-morbidities, previous drug use, risk factors not captured in the FRAX model (e.g. falls, vitamin D deficiency, increased bone turnover, interval significant decline in bone density) and possible under - or over-estimation of fracture risk by FRAX. All patients should ensure an adequate intake of dietary calcium (1200 mg/d) and vitamin D (800 IU daily) unless contraindicated. FOLLOW-UP: People with diagnosed  cases of osteoporosis or at high risk for fracture should have regular bone mineral density tests. For patients eligible for Medicare, routine testing is allowed once every 2 years. The testing frequency can be increased to one year for patients who have rapidly progressing disease, those who are receiving or discontinuing medical therapy to restore bone mass, or have additional risk factors. I have reviewed this report and agree with the above findings. [facility] Radiology Patient: Sierra Hines   Referring Physician: Talmadge Coventry [facility] Birth Date: 1946/01/04 [age] Patient ID: [id number] Height: 63.0 in. Weight: 232.8 lbs. Measured: 05/09/2017 2:22:06 PM (16 SP 2) Sex: Female Ethnicity: White Analyzed: 05/09/2017 2:24:53 PM (16 SP 2) FRAX* 10-year Probability of Fracture Based on femoral neck BMD: DualFemur (Left) Major Osteoporotic Fracture: 21.7% Hip Fracture:                5.4% Population:                   [place] (Caucasian) Risk Factors: Family Hist. (Parent hip fracture), History of Fracture (Adult) *FRAX is a Armed forces logistics/support/administrative officer of the [employer] of [employer] for Metabolic Bone Disease, a World Science writer (WHO) [employer]. ASSESSMENT: The probability of a major osteoporotic fracture is 21.7% within the next ten years. The probability of a hip fracture is 5.4% within the next ten years. Electronically Signed   By: Bretta Bang III M.D.   On: 05/09/2017 14:52    Assessment & Plan:   Sierra Hines was seen today for follow-up.  Diagnoses and all orders for this visit:  HTN (hypertension), benign -     lisinopril (PRINIVIL,ZESTRIL) 10 MG tablet; Take 1 tablet (10 mg total) by mouth daily. -     Basic metabolic panel -     Urinalysis, Routine w reflex microscopic  Need for influenza vaccination -     Flu vaccine HIGH DOSE PF  Depression, recurrent (HCC) -     FLUoxetine (PROZAC) 40 MG capsule; Take 1 capsule (40 mg total) by mouth daily. Take 1 capsule by mouth daily. -     buPROPion (WELLBUTRIN XL) 150 MG 24 hr tablet; Take 1 tablet (150 mg total) by mouth daily.  CKD (chronic kidney disease) stage 3, GFR 30-59 ml/min (HCC) -     lisinopril (PRINIVIL,ZESTRIL) 10 MG tablet; Take 1 tablet (10 mg total) by mouth daily. -     Basic metabolic panel -     Urinalysis, Routine w reflex microscopic  Dehydration -     Urinalysis, Routine w reflex microscopic  Microcytic anemia -     ferrous sulfate 325 (65 FE) MG tablet; Take 1 tablet (325 mg total) by mouth daily with breakfast.   I have discontinued Sierra Hines's lisinopril-hydrochlorothiazide and lisinopril-hydrochlorothiazide. I have also changed her buPROPion. Additionally, I am having her start on lisinopril and ferrous sulfate. Lastly, I am having her maintain her fexofenadine, aspirin EC, Vitamin D (Ergocalciferol), Calcium, ACCU-CHEK AVIVA PLUS, atorvastatin, Exenatide ER, insulin glargine,  Insulin Syringe-Needle U-100, and FLUoxetine.  Meds ordered this encounter  Medications  . lisinopril (PRINIVIL,ZESTRIL) 10 MG tablet    Sig: Take 1 tablet (10 mg total) by mouth daily.    Dispense:  90 tablet    Refill:  3  . FLUoxetine (PROZAC) 40 MG capsule    Sig: Take 1 capsule (40 mg total) by mouth daily. Take 1 capsule by mouth daily.    Dispense:  90 capsule  Refill:  1  . buPROPion (WELLBUTRIN XL) 150 MG 24 hr tablet    Sig: Take 1 tablet (150 mg total) by mouth daily.    Dispense:  90 tablet    Refill:  1  . ferrous sulfate 325 (65 FE) MG tablet    Sig: Take 1 tablet (325 mg total) by mouth daily with breakfast.    Dispense:  90 tablet    Refill:  0   Depression has improved with Wellbutrin augmentation.  We will go ahead and discontinue lisinopril with HCTZ and start lisinopril alone.  Hopefully this will help the dehydration.  She will continue to push fluids as tolerated.  She will continue to monitor her blood pressure daily and also follow-up with nephrology in a month.  Microcytic anemia is being followed up today.  She will follow-up in 2 months.  Follow-up: Return in about 2 months (around 03/03/2018).  Mliss Sax, MD

## 2018-01-02 LAB — IRON,TIBC AND FERRITIN PANEL
%SAT: 8 % (calc) — ABNORMAL LOW (ref 16–45)
Ferritin: 23 ng/mL (ref 16–288)
IRON: 34 ug/dL — AB (ref 45–160)
TIBC: 441 mcg/dL (calc) (ref 250–450)

## 2018-01-02 LAB — RETICULOCYTES
ABS RETIC: 55660 {cells}/uL (ref 20000–8000)
RETIC CT PCT: 1.1 %

## 2018-01-02 MED ORDER — FERROUS SULFATE 325 (65 FE) MG PO TABS: 325.0000 mg | ORAL_TABLET | Freq: Every day | ORAL | 0 refills | Status: DC

## 2018-01-02 NOTE — Addendum Note (Signed)
Addended by: Andrez Grime on: 01/02/2018 04:56 PM   Modules accepted: Orders

## 2018-02-26 ENCOUNTER — Encounter: Payer: Self-pay | Admitting: Family Medicine

## 2018-02-26 ENCOUNTER — Ambulatory Visit: Payer: Medicare Other | Admitting: Family Medicine

## 2018-02-26 VITALS — BP 120/74 | HR 102 | Ht 64.0 in | Wt 223.2 lb

## 2018-02-26 DIAGNOSIS — E1122 Type 2 diabetes mellitus with diabetic chronic kidney disease: Secondary | ICD-10-CM

## 2018-02-26 DIAGNOSIS — N183 Chronic kidney disease, stage 3 unspecified: Secondary | ICD-10-CM

## 2018-02-26 DIAGNOSIS — D509 Iron deficiency anemia, unspecified: Secondary | ICD-10-CM | POA: Diagnosis not present

## 2018-02-26 DIAGNOSIS — Z794 Long term (current) use of insulin: Secondary | ICD-10-CM

## 2018-02-26 DIAGNOSIS — F339 Major depressive disorder, recurrent, unspecified: Secondary | ICD-10-CM

## 2018-02-26 MED ORDER — "INSULIN SYRINGE-NEEDLE U-100 30G X 5/16"" 1 ML MISC": 3 refills | Status: AC

## 2018-02-26 MED ORDER — FERROUS SULFATE 325 (65 FE) MG PO TABS: 325.0000 mg | ORAL_TABLET | Freq: Every day | ORAL | 0 refills | Status: DC

## 2018-02-26 MED ORDER — BUPROPION HCL ER (XL) 150 MG PO TB24: 150.0000 mg | ORAL_TABLET | Freq: Every day | ORAL | 1 refills | Status: DC

## 2018-02-26 MED ORDER — FLUOXETINE HCL 40 MG PO CAPS: 40.0000 mg | ORAL_CAPSULE | Freq: Every day | ORAL | 1 refills | Status: DC

## 2018-02-26 MED ORDER — INSULIN GLARGINE 100 UNIT/ML ~~LOC~~ SOLN: SUBCUTANEOUS | 1 refills | Status: DC

## 2018-02-26 NOTE — Progress Notes (Signed)
Established Patient Office Visit  Subjective:  Patient ID: Sierra Hines, female    DOB: Dec 29, 1945  Age: 72 y.o. MRN: [id number]  CC:  Chief Complaint  Patient presents with  . Follow-up    HPI Sierra Hines presents for follow-up of her depression diabetes and iron deficiency anemia.  Depression is much improved with Wellbutrin augmentation.  Spirits seem to be much brighter.  She is actually been able to lose some weight.  She got an excellent report from nephrology who said that her renal function is back to where it was when she first started to see them.  She is feeling much better with the daily iron therapy.  She denies constipation but is having some black stool.  Occasional stomach upset with iron ingestion.  She sees no blood in her stool or urine.  Planning on going up to [place] this coming week.  Past Medical History:  Diagnosis Date  . DM (diabetes mellitus) type II controlled with renal manifestation (HCC)   . Elevated LDL cholesterol level   . HTN (hypertension), benign     Past Surgical History:  Procedure Laterality Date  . CESAREAN SECTION  1980    Family History  Problem Relation Age of Onset  . Arthritis Mother   . Depression Mother   . Diabetes Mother   . Hearing loss Mother   . Arthritis Father   . Diabetes Father   . High Cholesterol Father   . High blood pressure Father   . Kidney disease Father   . Depression Sister   . Diabetes Sister   . Early death Sister     Social History   Socioeconomic History  . Marital status: Single    Spouse name: Not on file  . Number of children: Not on file  . Years of education: Not on file  . Highest education level: Not on file  Occupational History  . Not on file  Social Needs  . Financial resource strain: Not hard at all  . Food insecurity:    Worry: Never true    Inability: Never true  . Transportation needs:    Medical: No    Non-medical: No  Tobacco Use  . Smoking status: Former Games developer  .  Smokeless tobacco: Never Used  Substance and Sexual Activity  . Alcohol use: No    Frequency: Never  . Drug use: No  . Sexual activity: Never  Lifestyle  . Physical activity:    Days per week: Not on file    Minutes per session: Not on file  . Stress: Not on file  Relationships  . Social connections:    Talks on phone: Not on file    Gets together: Not on file    Attends religious service: Not on file    Active member of club or organization: Not on file    Attends meetings of clubs or organizations: Not on file    Relationship status: Not on file  . Intimate partner violence:    Fear of current or ex partner: Not on file    Emotionally abused: Not on file    Physically abused: Not on file    Forced sexual activity: Not on file  Other Topics Concern  . Not on file  Social History Narrative  . Not on file    Outpatient Medications Prior to Visit  Medication Sig Dispense Refill  . ACCU-CHEK AVIVA PLUS test strip Use to test blood sugar 1-2 times per day.  3  . aspirin EC 81 MG tablet Take 1 tablet (81 mg total) daily by mouth. 365 tablet 0  . Calcium 500 MG tablet Take one tablet twice daily. 60 tablet 3  . Exenatide ER 2 MG PEN Inject 2 mg into the skin once a week. 12 each 1  . fexofenadine (ALLEGRA) 180 MG tablet Take 1 tablet (180 mg total) daily by mouth. Take 1 tablet by mouth daily. 180 tablet 1  . lisinopril (PRINIVIL,ZESTRIL) 10 MG tablet Take 1 tablet (10 mg total) by mouth daily. 90 tablet 3  . Vitamin D, Ergocalciferol, (DRISDOL) 50000 units CAPS capsule Take 1 capsule by mouth once a week.    Marland Kitchen buPROPion (WELLBUTRIN XL) 150 MG 24 hr tablet Take 1 tablet (150 mg total) by mouth daily. 90 tablet 1  . ferrous sulfate 325 (65 FE) MG tablet Take 1 tablet (325 mg total) by mouth daily with breakfast. 90 tablet 0  . FLUoxetine (PROZAC) 40 MG capsule Take 1 capsule (40 mg total) by mouth daily. Take 1 capsule by mouth daily. 90 capsule 1  . insulin glargine (LANTUS) 100  UNIT/ML injection May use up to 60 Units nightly as directed 60 mL 1  . Insulin Syringe-Needle U-100 30G X 5/16" 1 ML MISC To use with insulin. 100 each 3  . atorvastatin (LIPITOR) 20 MG tablet Take 1 tablet (20 mg total) by mouth daily for 1 dose. 90 tablet 1   No facility-administered medications prior to visit.     Allergies  Allergen Reactions  . Pollen Extract Other (See Comments)    ROS Review of Systems  Constitutional: Negative.   Respiratory: Negative.   Cardiovascular: Negative.   Gastrointestinal: Negative.   Endocrine: Negative for polyphagia and polyuria.  Neurological: Negative.   Hematological: Negative.    Depression screen [facility] 2/9 02/26/2018 01/01/2018 11/07/2017  Decreased Interest 1 1 2   Down, Depressed, Hopeless 1 1 2   PHQ - 2 Score 2 2 4   Altered sleeping 1 1 3   Tired, decreased energy 1 1 3   Change in appetite 0 0 1  Feeling bad or failure about yourself  1 1 1   Trouble concentrating 1 0 1  Moving slowly or fidgety/restless 0 0 0  Suicidal thoughts 0 0 0  PHQ-9 Score 6 5 13       Objective:    Physical Exam  Constitutional: She appears well-developed and well-nourished. No distress.  HENT:  Head: Normocephalic and atraumatic.  Right Ear: External ear normal.  Left Ear: External ear normal.  Eyes: Conjunctivae are normal. Right eye exhibits no discharge. Left eye exhibits no discharge. No scleral icterus.  Neck: Neck supple. No JVD present. No tracheal deviation present.  Cardiovascular: Normal rate, regular rhythm and normal heart sounds.  Pulmonary/Chest: Effort normal.  Skin: Skin is warm and dry. She is not diaphoretic.  Psychiatric: She has a normal mood and affect. Her behavior is normal.    BP 120/74   Pulse (!) 102   Ht 5\' 4"  (1.626 m)   Wt 223 lb 4 oz (101.3 kg)   SpO2 97%   BMI 38.32 kg/m  Wt Readings from Last 3 Encounters:  02/26/18 223 lb 4 oz (101.3 kg)  01/01/18 225 lb 4 oz (102.2 kg)  11/21/17 231 lb 3.2 oz (104.9 kg)     BP Readings from Last 3 Encounters:  02/26/18 120/74  01/01/18 126/70  11/21/17 140/80   Health Maintenance Due  Topic Date Due  . FOOT EXAM  08/22/1955  . TETANUS/TDAP  08/21/1964  . MAMMOGRAM  08/22/1995  . COLONOSCOPY  08/22/1995    There are no preventive care reminders to display for this patient.  Lab Results  Component Value Date   TSH 3.21 01/30/2017   Lab Results  Component Value Date   WBC 9.1 11/07/2017   HGB 11.8 (L) 11/07/2017   HCT 35.5 (L) 11/07/2017   MCV 75.6 (L) 11/07/2017   PLT 315.0 11/07/2017   Lab Results  Component Value Date   NA 133 (L) 01/01/2018   K 4.5 01/01/2018   CO2 27 01/01/2018   GLUCOSE 118 (H) 01/01/2018   BUN 25 (H) 01/01/2018   CREATININE 1.71 (H) 01/01/2018   BILITOT 0.7 01/30/2017   ALKPHOS 95 01/30/2017   AST 17 01/30/2017   ALT 16 01/30/2017   PROT 7.4 01/30/2017   ALBUMIN 4.3 01/30/2017   CALCIUM 10.2 01/01/2018   GFR 31.16 (L) 01/01/2018   Lab Results  Component Value Date   CHOL 105 11/07/2017   Lab Results  Component Value Date   HDL 55.20 11/07/2017   Lab Results  Component Value Date   LDLCALC 36 11/07/2017   Lab Results  Component Value Date   TRIG 72.0 11/07/2017   Lab Results  Component Value Date   CHOLHDL 2 11/07/2017   Lab Results  Component Value Date   HGBA1C 6.8 (H) 11/07/2017      Assessment & Plan:   Problem List Items Addressed This Visit      Endocrine   Type 2 diabetes mellitus with stage 3 chronic kidney disease, with long-term current use of insulin (HCC) - Primary   Relevant Medications   Insulin Syringe-Needle U-100 30G X 5/16" 1 ML MISC   insulin glargine (LANTUS) 100 UNIT/ML injection     Other   Depression, recurrent (HCC)   Relevant Medications   FLUoxetine (PROZAC) 40 MG capsule   buPROPion (WELLBUTRIN XL) 150 MG 24 hr tablet   Microcytic anemia   Relevant Medications   ferrous sulfate 325 (65 FE) MG tablet      Meds ordered this encounter  Medications   . ferrous sulfate 325 (65 FE) MG tablet    Sig: Take 1 tablet (325 mg total) by mouth daily with breakfast.    Dispense:  90 tablet    Refill:  0  . Insulin Syringe-Needle U-100 30G X 5/16" 1 ML MISC    Sig: To use with insulin.    Dispense:  100 each    Refill:  3  . insulin glargine (LANTUS) 100 UNIT/ML injection    Sig: May use up to 60 Units nightly as directed    Dispense:  60 mL    Refill:  1  . FLUoxetine (PROZAC) 40 MG capsule    Sig: Take 1 capsule (40 mg total) by mouth daily. Take 1 capsule by mouth daily.    Dispense:  90 capsule    Refill:  1  . buPROPion (WELLBUTRIN XL) 150 MG 24 hr tablet    Sig: Take 1 tablet (150 mg total) by mouth daily.    Dispense:  90 tablet    Refill:  1    Follow-up: Return in about 3 months (around 05/28/2018).

## 2018-04-22 ENCOUNTER — Other Ambulatory Visit: Payer: Self-pay | Admitting: Family Medicine

## 2018-04-22 DIAGNOSIS — E7800 Pure hypercholesterolemia, unspecified: Secondary | ICD-10-CM

## 2018-05-01 ENCOUNTER — Other Ambulatory Visit: Payer: Self-pay | Admitting: Family Medicine

## 2018-05-01 DIAGNOSIS — Z794 Long term (current) use of insulin: Principal | ICD-10-CM

## 2018-05-01 DIAGNOSIS — E1122 Type 2 diabetes mellitus with diabetic chronic kidney disease: Secondary | ICD-10-CM

## 2018-05-01 DIAGNOSIS — N183 Chronic kidney disease, stage 3 unspecified: Secondary | ICD-10-CM

## 2018-05-22 ENCOUNTER — Other Ambulatory Visit: Payer: Self-pay | Admitting: Family Medicine

## 2018-05-22 DIAGNOSIS — D509 Iron deficiency anemia, unspecified: Secondary | ICD-10-CM

## 2018-05-28 ENCOUNTER — Ambulatory Visit: Payer: Medicare Other | Admitting: Family Medicine

## 2018-06-25 ENCOUNTER — Ambulatory Visit (INDEPENDENT_AMBULATORY_CARE_PROVIDER_SITE_OTHER): Payer: Medicare Other | Admitting: Family Medicine

## 2018-06-25 ENCOUNTER — Encounter: Payer: Self-pay | Admitting: Family Medicine

## 2018-06-25 VITALS — Ht 64.0 in

## 2018-06-25 DIAGNOSIS — N183 Chronic kidney disease, stage 3 unspecified: Secondary | ICD-10-CM

## 2018-06-25 DIAGNOSIS — E1122 Type 2 diabetes mellitus with diabetic chronic kidney disease: Secondary | ICD-10-CM | POA: Diagnosis not present

## 2018-06-25 DIAGNOSIS — E559 Vitamin D deficiency, unspecified: Secondary | ICD-10-CM

## 2018-06-25 DIAGNOSIS — F339 Major depressive disorder, recurrent, unspecified: Secondary | ICD-10-CM | POA: Diagnosis not present

## 2018-06-25 DIAGNOSIS — I10 Essential (primary) hypertension: Secondary | ICD-10-CM | POA: Diagnosis not present

## 2018-06-25 DIAGNOSIS — Z794 Long term (current) use of insulin: Secondary | ICD-10-CM

## 2018-06-25 DIAGNOSIS — E7800 Pure hypercholesterolemia, unspecified: Secondary | ICD-10-CM

## 2018-06-25 DIAGNOSIS — D509 Iron deficiency anemia, unspecified: Secondary | ICD-10-CM

## 2018-06-25 MED ORDER — INSULIN GLARGINE 100 UNIT/ML ~~LOC~~ SOLN: SUBCUTANEOUS | 1 refills | Status: DC

## 2018-06-25 MED ORDER — BUPROPION HCL ER (XL) 150 MG PO TB24: 150.0000 mg | ORAL_TABLET | Freq: Every day | ORAL | 1 refills | Status: DC

## 2018-06-25 NOTE — Progress Notes (Signed)
Established Patient Office Visit  Subjective:  Patient ID: Sierra Hines, female    DOB: [birth year]  Age: 73 y.o. MRN: [id number]  CC:  Chief Complaint  Patient presents with  . Follow-up    HPI Sierra Hines presents for follow-up of her hypertension, diabetes, elevated cholesterol chronic kidney disease depression and vitamin D deficiency.  She continues to take all of her current medicines without issues.  She canceled her appointment with nephrology due to concerns about the current pandemic.  She is certainly been sheltering at home.  She rarely eventually ventures out of her house home.  Recently her labs have been stable.  She has felt isolated and a little sad at home.  This is been helped a great deal by video chats with her daughter and grandchildren.  Past Medical History:  Diagnosis Date  . DM (diabetes mellitus) type II controlled with renal manifestation (HCC)   . Elevated LDL cholesterol level   . HTN (hypertension), benign     Past Surgical History:  Procedure Laterality Date  . CESAREAN SECTION  1980    Family History  Problem Relation Age of Onset  . Arthritis Mother   . Depression Mother   . Diabetes Mother   . Hearing loss Mother   . Arthritis Father   . Diabetes Father   . High Cholesterol Father   . High blood pressure Father   . Kidney disease Father   . Depression Sister   . Diabetes Sister   . Early death Sister     Social History   Socioeconomic History  . Marital status: Single    Spouse name: Not on file  . Number of children: Not on file  . Years of education: Not on file  . Highest education level: Not on file  Occupational History  . Not on file  Social Needs  . Financial resource strain: Not hard at all  . Food insecurity:    Worry: Never true    Inability: Never true  . Transportation needs:    Medical: No    Non-medical: No  Tobacco Use  . Smoking status: Former Games developer  . Smokeless tobacco: Never Used  Substance and  Sexual Activity  . Alcohol use: No    Frequency: Never  . Drug use: No  . Sexual activity: Never  Lifestyle  . Physical activity:    Days per week: Not on file    Minutes per session: Not on file  . Stress: Not on file  Relationships  . Social connections:    Talks on phone: Not on file    Gets together: Not on file    Attends religious service: Not on file    Active member of club or organization: Not on file    Attends meetings of clubs or organizations: Not on file    Relationship status: Not on file  . Intimate partner violence:    Fear of current or ex partner: Not on file    Emotionally abused: Not on file    Physically abused: Not on file    Forced sexual activity: Not on file  Other Topics Concern  . Not on file  Social History Narrative  . Not on file    Outpatient Medications Prior to Visit  Medication Sig Dispense Refill  . ACCU-CHEK AVIVA PLUS test strip Use to test blood sugar 1-2 times per day.  3  . aspirin EC 81 MG tablet Take 1 tablet (81 mg total) daily by  mouth. 365 tablet 0  . atorvastatin (LIPITOR) 20 MG tablet TAKE 1 TABLET(20 MG) BY MOUTH ONCE DAILY 90 tablet 1  . BYDUREON 2 MG PEN INJECT 1 PEN INTO THE SKIN ONCE A WEEK 12 each 2  . Calcium 500 MG tablet Take one tablet twice daily. 60 tablet 3  . FEROSUL 325 (65 Fe) MG tablet TAKE 1 TABLET(325 MG) BY MOUTH DAILY WITH BREAKFAST 90 tablet 0  . fexofenadine (ALLEGRA) 180 MG tablet Take 1 tablet (180 mg total) daily by mouth. Take 1 tablet by mouth daily. 180 tablet 1  . FLUoxetine (PROZAC) 40 MG capsule Take 1 capsule (40 mg total) by mouth daily. Take 1 capsule by mouth daily. 90 capsule 1  . Insulin Syringe-Needle U-100 30G X 5/16" 1 ML MISC To use with insulin. 100 each 3  . lisinopril (PRINIVIL,ZESTRIL) 10 MG tablet Take 1 tablet (10 mg total) by mouth daily. 90 tablet 3  . Vitamin D, Ergocalciferol, (DRISDOL) 50000 units CAPS capsule Take 1 capsule by mouth once a week.    Marland Kitchen. buPROPion (WELLBUTRIN  XL) 150 MG 24 hr tablet Take 1 tablet (150 mg total) by mouth daily. 90 tablet 1  . insulin glargine (LANTUS) 100 UNIT/ML injection May use up to 60 Units nightly as directed 60 mL 1   No facility-administered medications prior to visit.     Allergies  Allergen Reactions  . Pollen Extract Other (See Comments)    ROS Review of Systems  Constitutional: Negative.   Respiratory: Negative.   Cardiovascular: Negative.   Gastrointestinal: Negative.   Endocrine: Negative for polyphagia and polyuria.  Neurological: Negative.       Objective:    Physical Exam  Constitutional: She is oriented to person, place, and time. She appears well-developed and well-nourished.  HENT:  Head: Normocephalic and atraumatic.  Right Ear: External ear normal.  Left Ear: External ear normal.  Eyes: Right eye exhibits no discharge. Left eye exhibits no discharge. No scleral icterus.  Pulmonary/Chest: Effort normal.  Neurological: She is alert and oriented to person, place, and time.  Skin: Skin is warm and dry. She is not diaphoretic.  Psychiatric: She has a normal mood and affect. Her behavior is normal.    Ht 5\' 4"  (1.626 m)   BMI 38.32 kg/m  Wt Readings from Last 3 Encounters:  02/26/18 223 lb 4 oz (101.3 kg)  01/01/18 225 lb 4 oz (102.2 kg)  11/21/17 231 lb 3.2 oz (104.9 kg)     Health Maintenance Due  Topic Date Due  . FOOT EXAM  08/22/1955  . TETANUS/TDAP  08/21/1964  . MAMMOGRAM  08/22/1995  . COLONOSCOPY  08/22/1995  . HEMOGLOBIN A1C  05/10/2018    There are no preventive care reminders to display for this patient.  Lab Results  Component Value Date   TSH 3.21 01/30/2017   Lab Results  Component Value Date   WBC 9.1 11/07/2017   HGB 11.8 (L) 11/07/2017   HCT 35.5 (L) 11/07/2017   MCV 75.6 (L) 11/07/2017   PLT 315.0 11/07/2017   Lab Results  Component Value Date   NA 133 (L) 01/01/2018   K 4.5 01/01/2018   CO2 27 01/01/2018   GLUCOSE 118 (H) 01/01/2018   BUN 25 (H)  01/01/2018   CREATININE 1.71 (H) 01/01/2018   BILITOT 0.7 01/30/2017   ALKPHOS 95 01/30/2017   AST 17 01/30/2017   ALT 16 01/30/2017   PROT 7.4 01/30/2017   ALBUMIN 4.3 01/30/2017  CALCIUM 10.2 01/01/2018   GFR 31.16 (L) 01/01/2018   Lab Results  Component Value Date   CHOL 105 11/07/2017   Lab Results  Component Value Date   HDL 55.20 11/07/2017   Lab Results  Component Value Date   LDLCALC 36 11/07/2017   Lab Results  Component Value Date   TRIG 72.0 11/07/2017   Lab Results  Component Value Date   CHOLHDL 2 11/07/2017   Lab Results  Component Value Date   HGBA1C 6.8 (H) 11/07/2017      Assessment & Plan:   Problem List Items Addressed This Visit      Cardiovascular and Mediastinum   HTN (hypertension), benign   Relevant Orders   Comprehensive metabolic panel     Endocrine   Type 2 diabetes mellitus with stage 3 chronic kidney disease, with long-term current use of insulin (HCC)   Relevant Medications   insulin glargine (LANTUS) 100 UNIT/ML injection   Other Relevant Orders   Comprehensive metabolic panel   Hemoglobin A1c     Genitourinary   CKD (chronic kidney disease) stage 3, GFR 30-59 ml/min (HCC)   Relevant Orders   CBC   Comprehensive metabolic panel     Other   Depression, recurrent (HCC) - Primary   Relevant Medications   buPROPion (WELLBUTRIN XL) 150 MG 24 hr tablet   Elevated LDL cholesterol level   Relevant Orders   Comprehensive metabolic panel   LDL cholesterol, direct   Vitamin D deficiency   Relevant Orders   VITAMIN D 25 Hydroxy (Vit-D Deficiency, Fractures)   Microcytic anemia   Relevant Orders   CBC   Iron, TIBC and Ferritin Panel      Meds ordered this encounter  Medications  . insulin glargine (LANTUS) 100 UNIT/ML injection    Sig: May use up to 60 Units nightly as directed    Dispense:  60 mL    Refill:  1  . buPROPion (WELLBUTRIN XL) 150 MG 24 hr tablet    Sig: Take 1 tablet (150 mg total) by mouth daily.     Dispense:  90 tablet    Refill:  1    Follow-up: Return in about 3 months (around 09/24/2018).    Mliss Sax, MDVirtual Visit via Video Note  I connected with Sierra Hines on 06/25/18 at  3:00 PM EDT by a video enabled telemedicine application and verified that I am speaking with the correct person using two identifiers.   I discussed the limitations of evaluation and management by telemedicine and the availability of in person appointments. The patient expressed understanding and agreed to proceed.  History of Present Illness:    Observations/Objective:   Assessment and Plan:   Follow Up Instructions:    I discussed the assessment and treatment plan with the patient. The patient was provided an opportunity to ask questions and all were answered. The patient agreed with the plan and demonstrated an understanding of the instructions.   The patient was advised to call back or seek an in-person evaluation if the symptoms worsen or if the condition fails to improve as anticipated.  I provided 20 minutes of non-face-to-face time during this encounter.   Patient will make a clinic appointment for her blood work in the near future.  Encouraged her to exercise in her neighborhood with walking maintaining social distance.  Hopefully her blood work will continue to look good and I said that I would like to see her in 3 months.

## 2018-08-20 ENCOUNTER — Other Ambulatory Visit: Payer: Self-pay | Admitting: Family Medicine

## 2018-08-20 DIAGNOSIS — D509 Iron deficiency anemia, unspecified: Secondary | ICD-10-CM

## 2018-10-19 ENCOUNTER — Other Ambulatory Visit: Payer: Self-pay | Admitting: Family Medicine

## 2018-10-19 DIAGNOSIS — E7800 Pure hypercholesterolemia, unspecified: Secondary | ICD-10-CM

## 2018-10-29 ENCOUNTER — Ambulatory Visit (INDEPENDENT_AMBULATORY_CARE_PROVIDER_SITE_OTHER): Payer: Medicare Other | Admitting: Family Medicine

## 2018-10-29 ENCOUNTER — Encounter: Payer: Self-pay | Admitting: Family Medicine

## 2018-10-29 DIAGNOSIS — E7800 Pure hypercholesterolemia, unspecified: Secondary | ICD-10-CM

## 2018-10-29 DIAGNOSIS — E559 Vitamin D deficiency, unspecified: Secondary | ICD-10-CM

## 2018-10-29 DIAGNOSIS — I10 Essential (primary) hypertension: Secondary | ICD-10-CM

## 2018-10-29 DIAGNOSIS — D509 Iron deficiency anemia, unspecified: Secondary | ICD-10-CM

## 2018-10-29 DIAGNOSIS — N183 Chronic kidney disease, stage 3 unspecified: Secondary | ICD-10-CM

## 2018-10-29 DIAGNOSIS — E1122 Type 2 diabetes mellitus with diabetic chronic kidney disease: Secondary | ICD-10-CM | POA: Diagnosis not present

## 2018-10-29 DIAGNOSIS — Z794 Long term (current) use of insulin: Secondary | ICD-10-CM

## 2018-10-29 DIAGNOSIS — D649 Anemia, unspecified: Secondary | ICD-10-CM

## 2018-10-29 NOTE — Progress Notes (Signed)
Established Patient Office Visit  Subjective:  Patient ID: Sierra Hines, female    DOB: March 23, 1945  Age: 73 y.o. MRN: [id number]  CC:  Chief Complaint  Patient presents with  . Follow-up    HPI Sierra Hines presents for follow-up of her diabetes, elevated cholesterol, hypertension, anemia and vitamin D does deficiency.  Fasting sugars have been running in the 130 or less range.  She says that her 2-hour postprandial have been mostly in the less than 160 range.  She has been able to lose about 10 pounds.  Blood pressure has been running in the 120 over 70s range.  She continues to shelter at home.  She rarely ventures out.  She orders her groceries online.  She has been afraid to come to the clinic.  Reminded her that her last blood work is almost 6510 months old.  Reminded her that she has serious issue and needs to be followed every 3 months.  She canceled her appointments with her dentist and the ophthalmologist.  Past Medical History:  Diagnosis Date  . DM (diabetes mellitus) type II controlled with renal manifestation (HCC)   . Elevated LDL cholesterol level   . HTN (hypertension), benign     Past Surgical History:  Procedure Laterality Date  . CESAREAN SECTION  1980    Family History  Problem Relation Age of Onset  . Arthritis Mother   . Depression Mother   . Diabetes Mother   . Hearing loss Mother   . Arthritis Father   . Diabetes Father   . High Cholesterol Father   . High blood pressure Father   . Kidney disease Father   . Depression Sister   . Diabetes Sister   . Early death Sister     Social History   Socioeconomic History  . Marital status: Single    Spouse name: Not on file  . Number of children: Not on file  . Years of education: Not on file  . Highest education level: Not on file  Occupational History  . Not on file  Social Needs  . Financial resource strain: Not hard at all  . Food insecurity    Worry: Never true    Inability: Never true  .  Transportation needs    Medical: No    Non-medical: No  Tobacco Use  . Smoking status: Former Games developermoker  . Smokeless tobacco: Never Used  Substance and Sexual Activity  . Alcohol use: No    Frequency: Never  . Drug use: No  . Sexual activity: Never  Lifestyle  . Physical activity    Days per week: Not on file    Minutes per session: Not on file  . Stress: Not on file  Relationships  . Social Musicianconnections    Talks on phone: Not on file    Gets together: Not on file    Attends religious service: Not on file    Active member of club or organization: Not on file    Attends meetings of clubs or organizations: Not on file    Relationship status: Not on file  . Intimate partner violence    Fear of current or ex partner: Not on file    Emotionally abused: Not on file    Physically abused: Not on file    Forced sexual activity: Not on file  Other Topics Concern  . Not on file  Social History Narrative  . Not on file    Outpatient Medications Prior to Visit  Medication Sig Dispense Refill  . ACCU-CHEK AVIVA PLUS test strip Use to test blood sugar 1-2 times per day.  3  . aspirin EC 81 MG tablet Take 1 tablet (81 mg total) daily by mouth. 365 tablet 0  . atorvastatin (LIPITOR) 20 MG tablet TAKE 1 TABLET(20 MG) BY MOUTH EVERY DAY 90 tablet 1  . buPROPion (WELLBUTRIN XL) 150 MG 24 hr tablet Take 1 tablet (150 mg total) by mouth daily. 90 tablet 1  . BYDUREON 2 MG PEN INJECT 1 PEN INTO THE SKIN ONCE A WEEK 12 each 2  . Calcium 500 MG tablet Take one tablet twice daily. 60 tablet 3  . FEROSUL 325 (65 Fe) MG tablet TAKE 1 TABLET(325 MG) BY MOUTH DAILY WITH BREAKFAST 90 tablet 0  . fexofenadine (ALLEGRA) 180 MG tablet Take 1 tablet (180 mg total) daily by mouth. Take 1 tablet by mouth daily. 180 tablet 1  . FLUoxetine (PROZAC) 40 MG capsule Take 1 capsule (40 mg total) by mouth daily. Take 1 capsule by mouth daily. 90 capsule 1  . insulin glargine (LANTUS) 100 UNIT/ML injection May use up to  60 Units nightly as directed 60 mL 1  . Insulin Syringe-Needle U-100 30G X 5/16" 1 ML MISC To use with insulin. 100 each 3  . lisinopril (PRINIVIL,ZESTRIL) 10 MG tablet Take 1 tablet (10 mg total) by mouth daily. 90 tablet 3  . Vitamin D, Ergocalciferol, (DRISDOL) 50000 units CAPS capsule Take 1 capsule by mouth once a week.     No facility-administered medications prior to visit.     Allergies  Allergen Reactions  . Pollen Extract Other (See Comments)    ROS Review of Systems  Constitutional: Negative for chills, diaphoresis, fatigue, fever and unexpected weight change.  HENT: Negative.   Eyes: Negative for photophobia and visual disturbance.  Respiratory: Negative.   Cardiovascular: Negative.   Gastrointestinal: Negative.   Endocrine: Negative for polyphagia.  Genitourinary: Negative for dysuria, frequency and urgency.  Skin: Negative for pallor.  Allergic/Immunologic: Negative for immunocompromised state.  Neurological: Negative for speech difficulty and numbness.      Objective:    Physical Exam  Constitutional: She is oriented to person, place, and time. She appears well-developed and well-nourished. No distress.  HENT:  Head: Normocephalic and atraumatic.  Right Ear: External ear normal.  Left Ear: External ear normal.  Eyes: Right eye exhibits no discharge. Left eye exhibits no discharge. No scleral icterus.  Neck: No tracheal deviation present.  Pulmonary/Chest: Effort normal. No stridor.  Neurological: She is alert and oriented to person, place, and time.  Skin: She is not diaphoretic.  Psychiatric: She has a normal mood and affect. Her behavior is normal.    There were no vitals taken for this visit. Wt Readings from Last 3 Encounters:  02/26/18 223 lb 4 oz (101.3 kg)  01/01/18 225 lb 4 oz (102.2 kg)  11/21/17 231 lb 3.2 oz (104.9 kg)   BP Readings from Last 3 Encounters:  02/26/18 120/74  01/01/18 126/70  11/21/17 140/80   Guideline developer:   UpToDate (see UpToDate for funding source) Date Released: June 2014  Health Maintenance Due  Topic Date Due  . FOOT EXAM  08/22/1955  . TETANUS/TDAP  08/21/1964  . MAMMOGRAM  08/22/1995  . COLONOSCOPY  08/22/1995  . HEMOGLOBIN A1C  05/10/2018  . OPHTHALMOLOGY EXAM  07/14/2018  . INFLUENZA VACCINE  10/13/2018    There are no preventive care reminders to display for this patient.  Lab Results  Component Value Date   TSH 3.21 01/30/2017   Lab Results  Component Value Date   WBC 9.1 11/07/2017   HGB 11.8 (L) 11/07/2017   HCT 35.5 (L) 11/07/2017   MCV 75.6 (L) 11/07/2017   PLT 315.0 11/07/2017   Lab Results  Component Value Date   NA 133 (L) 01/01/2018   K 4.5 01/01/2018   CO2 27 01/01/2018   GLUCOSE 118 (H) 01/01/2018   BUN 25 (H) 01/01/2018   CREATININE 1.71 (H) 01/01/2018   BILITOT 0.7 01/30/2017   ALKPHOS 95 01/30/2017   AST 17 01/30/2017   ALT 16 01/30/2017   PROT 7.4 01/30/2017   ALBUMIN 4.3 01/30/2017   CALCIUM 10.2 01/01/2018   GFR 31.16 (L) 01/01/2018   Lab Results  Component Value Date   CHOL 105 11/07/2017   Lab Results  Component Value Date   HDL 55.20 11/07/2017   Lab Results  Component Value Date   LDLCALC 36 11/07/2017   Lab Results  Component Value Date   TRIG 72.0 11/07/2017   Lab Results  Component Value Date   CHOLHDL 2 11/07/2017   Lab Results  Component Value Date   HGBA1C 6.8 (H) 11/07/2017      Assessment & Plan:   Problem List Items Addressed This Visit      Cardiovascular and Mediastinum   HTN (hypertension), benign - Primary     Endocrine   Type 2 diabetes mellitus with stage 3 chronic kidney disease, with long-term current use of insulin (HCC)     Genitourinary   CKD (chronic kidney disease) stage 3, GFR 30-59 ml/min (HCC)     Other   Elevated LDL cholesterol level   Relevant Orders   Lipid panel   Vitamin D deficiency   Microcytic anemia    Other Visit Diagnoses    Anemia, unspecified type           No orders of the defined types were placed in this encounter.   Follow-up: No follow-ups on file.   Patient agrees to return fasting for the blood work that was ordered back in April as well as a lipid profile ordered today.  She was advised to follow-up with ophthalmology for an eye check.  Reassured her and explained the safety precautions that we are taking at the clinic with regards to COVID.  Virtual Visit via Video Note  I connected with Sierra Hines on 10/29/18 at  3:30 PM EDT by a video enabled telemedicine application and verified that I am speaking with the correct person using two identifiers.  Location: Patient: home Provider:    I discussed the limitations of evaluation and management by telemedicine and the availability of in person appointments. The patient expressed understanding and agreed to proceed.  History of Present Illness:    Observations/Objective:   Assessment and Plan:   Follow Up Instructions:    I discussed the assessment and treatment plan with the patient. The patient was provided an opportunity to ask questions and all were answered. The patient agreed with the plan and demonstrated an understanding of the instructions.   The patient was advised to call back or seek an in-person evaluation if the symptoms worsen or if the condition fails to improve as anticipated.  I provided 25 minutes of non-face-to-face time during this encounter.   Mliss SaxWilliam Alfred , MD

## 2018-11-13 ENCOUNTER — Other Ambulatory Visit (INDEPENDENT_AMBULATORY_CARE_PROVIDER_SITE_OTHER): Payer: Medicare Other

## 2018-11-13 ENCOUNTER — Other Ambulatory Visit: Payer: Self-pay

## 2018-11-13 DIAGNOSIS — N183 Chronic kidney disease, stage 3 unspecified: Secondary | ICD-10-CM

## 2018-11-13 DIAGNOSIS — E1122 Type 2 diabetes mellitus with diabetic chronic kidney disease: Secondary | ICD-10-CM | POA: Diagnosis not present

## 2018-11-13 DIAGNOSIS — E7800 Pure hypercholesterolemia, unspecified: Secondary | ICD-10-CM

## 2018-11-13 DIAGNOSIS — I10 Essential (primary) hypertension: Secondary | ICD-10-CM

## 2018-11-13 DIAGNOSIS — D509 Iron deficiency anemia, unspecified: Secondary | ICD-10-CM | POA: Diagnosis not present

## 2018-11-13 DIAGNOSIS — Z794 Long term (current) use of insulin: Secondary | ICD-10-CM

## 2018-11-13 LAB — COMPREHENSIVE METABOLIC PANEL
ALT: 10 U/L (ref 0–35)
AST: 12 U/L (ref 0–37)
Albumin: 3.8 g/dL (ref 3.5–5.2)
Alkaline Phosphatase: 81 U/L (ref 39–117)
BUN: 14 mg/dL (ref 6–23)
CO2: 28 mEq/L (ref 19–32)
Calcium: 9.4 mg/dL (ref 8.4–10.5)
Chloride: 101 mEq/L (ref 96–112)
Creatinine, Ser: 1.16 mg/dL (ref 0.40–1.20)
GFR: 45.76 mL/min — ABNORMAL LOW (ref >60.00)
Glucose, Bld: 83 mg/dL (ref 70–99)
Potassium: 4.3 mEq/L (ref 3.5–5.1)
Sodium: 138 mEq/L (ref 135–145)
Total Bilirubin: 0.7 mg/dL (ref 0.2–1.2)
Total Protein: 7.1 g/dL (ref 6.0–8.3)

## 2018-11-13 LAB — LIPID PANEL
Cholesterol: 112 mg/dL (ref 0–200)
HDL: 65.7 mg/dL (ref >39.00)
LDL Cholesterol: 34 mg/dL (ref 0–99)
NonHDL: 46.53
Total CHOL/HDL Ratio: 2
Triglycerides: 65 mg/dL (ref 0.0–149.0)
VLDL: 13 mg/dL (ref 0.0–40.0)

## 2018-11-13 LAB — CBC
HCT: 41.2 % (ref 36.0–46.0)
Hemoglobin: 13.6 g/dL (ref 12.0–15.0)
MCHC: 33.1 g/dL (ref 30.0–36.0)
MCV: 79.5 fl (ref 78.0–100.0)
Platelets: 284 10*3/uL (ref 150.0–400.0)
RBC: 5.18 Mil/uL — ABNORMAL HIGH (ref 3.87–5.11)
RDW: 16.2 % — ABNORMAL HIGH (ref 11.5–15.5)
WBC: 12.1 10*3/uL — ABNORMAL HIGH (ref 4.0–10.5)

## 2018-11-13 LAB — LDL CHOLESTEROL, DIRECT: Direct LDL: 31 mg/dL

## 2018-11-13 LAB — HEMOGLOBIN A1C: Hgb A1c MFr Bld: 5.8 % (ref 4.6–6.5)

## 2018-11-14 LAB — IRON,TIBC AND FERRITIN PANEL
%SAT: 10 % (calc) — ABNORMAL LOW (ref 16–45)
Ferritin: 74 ng/mL (ref 16–288)
Iron: 29 ug/dL — ABNORMAL LOW (ref 45–160)
TIBC: 301 mcg/dL (calc) (ref 250–450)

## 2018-11-20 ENCOUNTER — Other Ambulatory Visit: Payer: Self-pay

## 2018-11-20 DIAGNOSIS — D509 Iron deficiency anemia, unspecified: Secondary | ICD-10-CM

## 2018-11-20 MED ORDER — FERROUS SULFATE 325 (65 FE) MG PO TABS: ORAL_TABLET | ORAL | 0 refills | Status: DC

## 2018-11-27 NOTE — Progress Notes (Addendum)
Virtual Visit via Video Note  I connected with patient on 11/28/18 at 11:00 AM EDT by audio enabled telemedicine application and verified that I am speaking with the correct person using two identifiers.   THIS ENCOUNTER IS A VIRTUAL VISIT DUE TO COVID-19 - PATIENT WAS NOT SEEN IN THE OFFICE. PATIENT HAS CONSENTED TO VIRTUAL VISIT / TELEMEDICINE VISIT   Location of patient: home  Location of provider: office  I discussed the limitations of evaluation and management by telemedicine and the availability of in person appointments. The patient expressed understanding and agreed to proceed.   Subjective:   Sierra Hines is a 73 y.o. female who presents for an Initial Medicare Annual Wellness Visit.  Review of Systems     Home Safety/Smoke Alarms: Feels safe in home. Smoke alarms in place.  Lives alone in 2 story home. Step over tub. Grab rail.    Female:       Mammo-  declines    Dexa scan- 05/09/17       CCS- declines Objective:     Advanced Directives 11/28/2018  Does Patient Have a Medical Advance Directive? Yes  Type of Paramedic of [place];Living will  Does patient want to make changes to medical advance directive? No - Patient declined  Copy of [place] in Chart? No - copy requested    Current Medications (verified) Outpatient Encounter Medications as of 11/28/2018  Medication Sig  . ACCU-CHEK AVIVA PLUS test strip Use to test blood sugar 1-2 times per day.  Marland Kitchen aspirin EC 81 MG tablet Take 1 tablet (81 mg total) daily by mouth.  Marland Kitchen atorvastatin (LIPITOR) 20 MG tablet TAKE 1 TABLET(20 MG) BY MOUTH EVERY DAY  . buPROPion (WELLBUTRIN XL) 150 MG 24 hr tablet Take 1 tablet (150 mg total) by mouth daily.  Marland Kitchen BYDUREON 2 MG PEN INJECT 1 PEN INTO THE SKIN ONCE A WEEK  . Calcium 500 MG tablet Take one tablet twice daily.  . ferrous sulfate (FEROSUL) 325 (65 FE) MG tablet TAKE 1 TABLET(325 MG) BY MOUTH DAILY WITH BREAKFAST  . fexofenadine  (ALLEGRA) 180 MG tablet Take 1 tablet (180 mg total) daily by mouth. Take 1 tablet by mouth daily.  Marland Kitchen FLUoxetine (PROZAC) 40 MG capsule Take 1 capsule (40 mg total) by mouth daily. Take 1 capsule by mouth daily.  . insulin glargine (LANTUS) 100 UNIT/ML injection May use up to 60 Units nightly as directed  . Insulin Syringe-Needle U-100 30G X 5/16" 1 ML MISC To use with insulin.  Marland Kitchen lisinopril (PRINIVIL,ZESTRIL) 10 MG tablet Take 1 tablet (10 mg total) by mouth daily.  . Vitamin D, Ergocalciferol, (DRISDOL) 50000 units CAPS capsule Take 1 capsule by mouth once a week.   No facility-administered encounter medications on file as of 11/28/2018.     Allergies (verified) Pollen extract   History: Past Medical History:  Diagnosis Date  . DM (diabetes mellitus) type II controlled with renal manifestation ([place])   . Elevated LDL cholesterol level   . HTN (hypertension), benign    Past Surgical History:  Procedure Laterality Date  . CESAREAN SECTION  1980   Family History  Problem Relation Age of Onset  . Arthritis Mother   . Depression Mother   . Diabetes Mother   . Hearing loss Mother   . Arthritis Father   . Diabetes Father   . High Cholesterol Father   . High blood pressure Father   . Kidney disease Father   .  Depression Sister   . Diabetes Sister   . Early death Sister    Social History   Socioeconomic History  . Marital status: Single    Spouse name: Not on file  . Number of children: Not on file  . Years of education: Not on file  . Highest education level: Not on file  Occupational History  . Not on file  Social Needs  . Financial resource strain: Not hard at all  . Food insecurity    Worry: Never true    Inability: Never true  . Transportation needs    Medical: No    Non-medical: No  Tobacco Use  . Smoking status: Former Games developermoker  . Smokeless tobacco: Never Used  Substance and Sexual Activity  . Alcohol use: No    Frequency: Never  . Drug use: No  . Sexual  activity: Never  Lifestyle  . Physical activity    Days per week: Not on file    Minutes per session: Not on file  . Stress: Not on file  Relationships  . Social Musicianconnections    Talks on phone: Not on file    Gets together: Not on file    Attends religious service: Not on file    Active member of club or organization: Not on file    Attends meetings of clubs or organizations: Not on file    Relationship status: Not on file  Other Topics Concern  . Not on file  Social History Narrative  . Not on file    Tobacco Counseling Counseling given: Not Answered   Clinical Intake: Pain : No/denies pain    Activities of Daily Living In your present state of health, do you have any difficulty performing the following activities: 11/28/2018 11/28/2018  Hearing? N N  Vision? N N  Difficulty concentrating or making decisions? N -  Walking or climbing stairs? N -  Dressing or bathing? N -  Doing errands, shopping? N -  Preparing Food and eating ? N -  Using the Toilet? N -  In the past six months, have you accidently leaked urine? N -  Do you have problems with loss of bowel control? N -  Managing your Medications? N -  Managing your Finances? N -  Housekeeping or managing your Housekeeping? N -  Some recent data might be hidden     Immunizations and Health Maintenance Immunization History  Administered Date(s) Administered  . Influenza, High Dose Seasonal PF 01/30/2017, 01/01/2018  . Pneumococcal Conjugate-13 04/18/2014  . Pneumococcal Polysaccharide-23 04/18/2010, 11/03/2016  . Zoster 03/22/2013   Health Maintenance Due  Topic Date Due  . FOOT EXAM  08/22/1955  . TETANUS/TDAP  08/21/1964  . MAMMOGRAM  08/22/1995  . COLONOSCOPY  08/22/1995  . OPHTHALMOLOGY EXAM  07/14/2018  . INFLUENZA VACCINE  10/13/2018    Patient Care Team: Mliss SaxKremer, William Alfred, MD as PCP - General (Family Medicine)  Indicate any recent Medical Services you may have received from other than [facility]  providers in the past year (date may be approximate).     Assessment:   This is a routine wellness examination for [name]. Physical assessment deferred to PCP.  Hearing/Vision screen Unable to assess. This visit is enabled though telemedicine due to Covid 19.  Dietary issues and exercise activities discussed: Current Exercise Habits: Home exercise routine, Type of exercise: walking, Time (Minutes): 25, Frequency (Times/Week): 5, Weekly Exercise (Minutes/Week): 125, Intensity: Mild, Exercise limited by: None identified Diet (meal preparation, eat out, water  intake, caffeinated beverages, dairy products, fruits and vegetables): in general, a "healthy" diet  , well balanced  Goals    . Patient Stated     Maintain healthy lifestyle.       Depression Screen PHQ 2/9 Scores 11/28/2018 02/26/2018 01/01/2018 11/07/2017 01/30/2017  PHQ - 2 Score 0 2 2 4  0  PHQ- 9 Score - 6 5 13  -    Fall Risk Fall Risk  11/28/2018 01/30/2017  Falls in the past year? 0 No   Cognitive Function: Ad8 score reviewed for issues:  Issues making decisions:no  Less interest in hobbies / activities:no  Repeats questions, stories (family complaining):no  Trouble using ordinary gadgets (microwave, computer, phone):no  Forgets the month or year: no  Mismanaging finances: no  Remembering appts:no  Daily problems with thinking and/or memory: no Ad8 score is=0         Screening Tests Health Maintenance  Topic Date Due  . FOOT EXAM  08/22/1955  . TETANUS/TDAP  08/21/1964  . MAMMOGRAM  08/22/1995  . COLONOSCOPY  08/22/1995  . OPHTHALMOLOGY EXAM  07/14/2018  . INFLUENZA VACCINE  10/13/2018  . HEMOGLOBIN A1C  05/13/2019  . DEXA SCAN  Completed  . Hepatitis C Screening  Completed  . PNA vac Low Risk Adult  Completed     Plan:   See you next year!   Continue to eat heart healthy diet (full of fruits, vegetables, whole grains, lean protein, water--limit salt, fat, and sugar intake) and increase  physical activity as tolerated.  Continue doing brain stimulating activities (puzzles, reading, adult coloring books, staying active) to keep memory sharp.   Bring a copy of your living will and/or healthcare power of attorney to your next office visit.   I have personally reviewed and noted the following in the patient's chart:   . Medical and social history . Use of alcohol, tobacco or illicit drugs  . Current medications and supplements . Functional ability and status . Nutritional status . Physical activity . Advanced directives . List of other physicians . Hospitalizations, surgeries, and ER visits in previous 12 months . Vitals . Screenings to include cognitive, depression, and falls . Referrals and appointments  In addition, I have reviewed and discussed with patient certain preventive protocols, quality metrics, and best practice recommendations. A written personalized care plan for preventive services as well as general preventive health recommendations were provided to patient.     [place]on [place], [place]   11/28/2018    Reviewed and agree.

## 2018-11-28 ENCOUNTER — Encounter: Payer: Self-pay | Admitting: *Deleted

## 2018-11-28 ENCOUNTER — Ambulatory Visit (INDEPENDENT_AMBULATORY_CARE_PROVIDER_SITE_OTHER): Payer: Medicare Other | Admitting: *Deleted

## 2018-11-28 DIAGNOSIS — Z0000 Encounter for general adult medical examination without abnormal findings: Secondary | ICD-10-CM

## 2018-11-28 NOTE — Patient Instructions (Signed)
See you next year!   Continue to eat heart healthy diet (full of fruits, vegetables, whole grains, lean protein, water--limit salt, fat, and sugar intake) and increase physical activity as tolerated.  Continue doing brain stimulating activities (puzzles, reading, adult coloring books, staying active) to keep memory sharp.   Bring a copy of your living will and/or healthcare power of attorney to your next office visit.   Sierra Hines , Thank you for taking time to come for your Medicare Wellness Visit. I appreciate your ongoing commitment to your health goals. Please review the following plan we discussed and let me know if I can assist you in the future.   These are the goals we discussed: Goals    . Patient Stated     Maintain healthy lifestyle.        This is a list of the screening recommended for you and due dates:  Health Maintenance  Topic Date Due  . Complete foot exam   08/22/1955  . Tetanus Vaccine  08/21/1964  . Mammogram  08/22/1995  . Colon Cancer Screening  08/22/1995  . Eye exam for diabetics  07/14/2018  . Flu Shot  10/13/2018  . Hemoglobin A1C  05/13/2019  . DEXA scan (bone density measurement)  Completed  .  Hepatitis C: One time screening is recommended by Center for Disease Control  (CDC) for  adults born from [birth year] through 1965.   Completed  . Pneumonia vaccines  Completed    Health Maintenance After Age 67 After age 15, you are at a higher risk for certain long-term diseases and infections as well as injuries from falls. Falls are a major cause of broken bones and head injuries in people who are older than age 50. Getting regular preventive care can help to keep you healthy and well. Preventive care includes getting regular testing and making lifestyle changes as recommended by your health care provider. Talk with your health care provider about:  Which screenings and tests you should have. A screening is a test that checks for a disease when you have no  symptoms.  A diet and exercise plan that is right for you. What should I know about screenings and tests to prevent falls? Screening and testing are the best ways to find a health problem early. Early diagnosis and treatment give you the best chance of managing medical conditions that are common after age 73. Certain conditions and lifestyle choices may make you more likely to have a fall. Your health care provider may recommend:  Regular vision checks. Poor vision and conditions such as cataracts can make you more likely to have a fall. If you wear glasses, make sure to get your prescription updated if your vision changes.  Medicine review. Work with your health care provider to regularly review all of the medicines you are taking, including over-the-counter medicines. Ask your health care provider about any side effects that may make you more likely to have a fall. Tell your health care provider if any medicines that you take make you feel dizzy or sleepy.  Osteoporosis screening. Osteoporosis is a condition that causes the bones to get weaker. This can make the bones weak and cause them to break more easily.  Blood pressure screening. Blood pressure changes and medicines to control blood pressure can make you feel dizzy.  Strength and balance checks. Your health care provider may recommend certain tests to check your strength and balance while standing, walking, or changing positions.  Foot health exam.  Foot pain and numbness, as well as not wearing proper footwear, can make you more likely to have a fall.  Depression screening. You may be more likely to have a fall if you have a fear of falling, feel emotionally low, or feel unable to do activities that you used to do.  Alcohol use screening. Using too much alcohol can affect your balance and may make you more likely to have a fall. What actions can I take to lower my risk of falls? General instructions  Talk with your health care  provider about your risks for falling. Tell your health care provider if: ? You fall. Be sure to tell your health care provider about all falls, even ones that seem minor. ? You feel dizzy, sleepy, or off-balance.  Take over-the-counter and prescription medicines only as told by your health care provider. These include any supplements.  Eat a healthy diet and maintain a healthy weight. A healthy diet includes low-fat dairy products, low-fat (lean) meats, and fiber from whole grains, beans, and lots of fruits and vegetables. Home safety  Remove any tripping hazards, such as rugs, cords, and clutter.  Install safety equipment such as grab bars in bathrooms and safety rails on stairs.  Keep rooms and walkways well-lit. Activity   Follow a regular exercise program to stay fit. This will help you maintain your balance. Ask your health care provider what types of exercise are appropriate for you.  If you need a cane or walker, use it as recommended by your health care provider.  Wear supportive shoes that have nonskid soles. Lifestyle  Do not drink alcohol if your health care provider tells you not to drink.  If you drink alcohol, limit how much you have: ? 0-1 drink a day for women. ? 0-2 drinks a day for men.  Be aware of how much alcohol is in your drink. In the [place] one drink equals one typical bottle of beer (12 oz), one-half glass of wine (5 oz), or one shot of hard liquor (1 oz).  Do not use any products that contain nicotine or tobacco, such as cigarettes and e-cigarettes. If you need help quitting, ask your health care provider. Summary  Having a healthy lifestyle and getting preventive care can help to protect your health and wellness after age 73.  Screening and testing are the best way to find a health problem early and help you avoid having a fall. Early diagnosis and treatment give you the best chance for managing medical conditions that are more common for people who  are older than age 73.  Falls are a major cause of broken bones and head injuries in people who are older than age 73. Take precautions to prevent a fall at home.  Work with your health care provider to learn what changes you can make to improve your health and wellness and to prevent falls. This information is not intended to replace advice given to you by your health care provider. Make sure you discuss any questions you have with your health care provider. Document Released: 01/11/2017 Document Revised: 06/21/2018 Document Reviewed: 01/11/2017 Elsevier Patient Education  2020 [employer]

## 2018-12-18 ENCOUNTER — Other Ambulatory Visit: Payer: Self-pay | Admitting: Family Medicine

## 2018-12-18 DIAGNOSIS — N183 Chronic kidney disease, stage 3 unspecified: Secondary | ICD-10-CM

## 2018-12-18 DIAGNOSIS — I10 Essential (primary) hypertension: Secondary | ICD-10-CM

## 2019-02-19 ENCOUNTER — Other Ambulatory Visit: Payer: Self-pay | Admitting: Family Medicine

## 2019-02-19 DIAGNOSIS — E1122 Type 2 diabetes mellitus with diabetic chronic kidney disease: Secondary | ICD-10-CM

## 2019-06-28 ENCOUNTER — Other Ambulatory Visit: Payer: Self-pay | Admitting: Family Medicine

## 2019-06-28 DIAGNOSIS — F339 Major depressive disorder, recurrent, unspecified: Secondary | ICD-10-CM

## 2019-10-01 ENCOUNTER — Other Ambulatory Visit: Payer: Self-pay | Admitting: Family Medicine

## 2019-10-01 DIAGNOSIS — Z794 Long term (current) use of insulin: Secondary | ICD-10-CM

## 2019-10-01 DIAGNOSIS — F339 Major depressive disorder, recurrent, unspecified: Secondary | ICD-10-CM

## 2019-10-01 DIAGNOSIS — D509 Iron deficiency anemia, unspecified: Secondary | ICD-10-CM

## 2019-10-01 DIAGNOSIS — E7800 Pure hypercholesterolemia, unspecified: Secondary | ICD-10-CM

## 2019-11-01 ENCOUNTER — Telehealth: Payer: Self-pay | Admitting: Family Medicine

## 2019-11-01 NOTE — Telephone Encounter (Signed)
LVM to resched appt date due to [facility] schedule change.  Please call to reschedule.

## 2019-12-04 ENCOUNTER — Ambulatory Visit: Payer: Self-pay | Admitting: *Deleted

## 2019-12-04 ENCOUNTER — Ambulatory Visit: Payer: Medicare Other

## 2019-12-17 ENCOUNTER — Telehealth: Payer: Self-pay | Admitting: Family Medicine

## 2019-12-17 NOTE — Telephone Encounter (Signed)
Left message for patient to schedule Annual Wellness Visit.  Please schedule with Nurse Health Advisor Martha Stanley, RN at [facility]  °

## 2020-01-09 ENCOUNTER — Other Ambulatory Visit: Payer: Self-pay | Admitting: Family Medicine

## 2020-01-09 DIAGNOSIS — F339 Major depressive disorder, recurrent, unspecified: Secondary | ICD-10-CM

## 2020-01-09 DIAGNOSIS — E7800 Pure hypercholesterolemia, unspecified: Secondary | ICD-10-CM

## 2020-01-09 DIAGNOSIS — N183 Chronic kidney disease, stage 3 unspecified: Secondary | ICD-10-CM

## 2020-01-09 DIAGNOSIS — I10 Essential (primary) hypertension: Secondary | ICD-10-CM

## 2020-02-07 NOTE — Telephone Encounter (Signed)
Called patient to schedule appointment for follow up on medications, no answer LM to call back and schedule appointment.  

## 2020-07-24 ENCOUNTER — Telehealth: Payer: Self-pay | Admitting: Family Medicine

## 2020-07-24 DIAGNOSIS — D649 Anemia, unspecified: Secondary | ICD-10-CM | POA: Diagnosis not present

## 2020-07-24 DIAGNOSIS — C20 Malignant neoplasm of rectum: Secondary | ICD-10-CM | POA: Diagnosis not present

## 2020-07-24 DIAGNOSIS — Z452 Encounter for adjustment and management of vascular access device: Secondary | ICD-10-CM | POA: Diagnosis not present

## 2020-07-24 DIAGNOSIS — E785 Hyperlipidemia, unspecified: Secondary | ICD-10-CM | POA: Diagnosis not present

## 2020-07-24 DIAGNOSIS — K6389 Other specified diseases of intestine: Secondary | ICD-10-CM | POA: Diagnosis not present

## 2020-07-24 DIAGNOSIS — D638 Anemia in other chronic diseases classified elsewhere: Secondary | ICD-10-CM | POA: Diagnosis not present

## 2020-07-24 DIAGNOSIS — R102 Pelvic and perineal pain: Secondary | ICD-10-CM | POA: Diagnosis not present

## 2020-07-24 DIAGNOSIS — R10814 Left lower quadrant abdominal tenderness: Secondary | ICD-10-CM | POA: Diagnosis not present

## 2020-07-24 DIAGNOSIS — E119 Type 2 diabetes mellitus without complications: Secondary | ICD-10-CM | POA: Diagnosis not present

## 2020-07-24 DIAGNOSIS — I10 Essential (primary) hypertension: Secondary | ICD-10-CM | POA: Diagnosis not present

## 2020-07-24 DIAGNOSIS — F419 Anxiety disorder, unspecified: Secondary | ICD-10-CM | POA: Diagnosis not present

## 2020-07-24 DIAGNOSIS — R935 Abnormal findings on diagnostic imaging of other abdominal regions, including retroperitoneum: Secondary | ICD-10-CM | POA: Diagnosis not present

## 2020-07-24 DIAGNOSIS — K6289 Other specified diseases of anus and rectum: Secondary | ICD-10-CM | POA: Diagnosis not present

## 2020-07-24 DIAGNOSIS — E538 Deficiency of other specified B group vitamins: Secondary | ICD-10-CM | POA: Diagnosis not present

## 2020-07-24 DIAGNOSIS — K449 Diaphragmatic hernia without obstruction or gangrene: Secondary | ICD-10-CM | POA: Diagnosis not present

## 2020-07-24 DIAGNOSIS — E1122 Type 2 diabetes mellitus with diabetic chronic kidney disease: Secondary | ICD-10-CM | POA: Diagnosis not present

## 2020-07-24 DIAGNOSIS — D509 Iron deficiency anemia, unspecified: Secondary | ICD-10-CM | POA: Diagnosis not present

## 2020-07-24 DIAGNOSIS — D72829 Elevated white blood cell count, unspecified: Secondary | ICD-10-CM | POA: Diagnosis not present

## 2020-07-24 DIAGNOSIS — D500 Iron deficiency anemia secondary to blood loss (chronic): Secondary | ICD-10-CM | POA: Diagnosis not present

## 2020-07-24 DIAGNOSIS — K921 Melena: Secondary | ICD-10-CM | POA: Diagnosis not present

## 2020-07-24 DIAGNOSIS — K56699 Other intestinal obstruction unspecified as to partial versus complete obstruction: Secondary | ICD-10-CM | POA: Diagnosis not present

## 2020-07-24 DIAGNOSIS — Z20822 Contact with and (suspected) exposure to covid-19: Secondary | ICD-10-CM | POA: Diagnosis not present

## 2020-07-24 DIAGNOSIS — Z8616 Personal history of COVID-19: Secondary | ICD-10-CM | POA: Diagnosis not present

## 2020-07-24 DIAGNOSIS — G893 Neoplasm related pain (acute) (chronic): Secondary | ICD-10-CM | POA: Diagnosis not present

## 2020-07-24 DIAGNOSIS — R52 Pain, unspecified: Secondary | ICD-10-CM | POA: Diagnosis not present

## 2020-07-24 DIAGNOSIS — C19 Malignant neoplasm of rectosigmoid junction: Secondary | ICD-10-CM | POA: Diagnosis not present

## 2020-07-24 DIAGNOSIS — K5900 Constipation, unspecified: Secondary | ICD-10-CM | POA: Diagnosis not present

## 2020-07-24 DIAGNOSIS — I447 Left bundle-branch block, unspecified: Secondary | ICD-10-CM | POA: Diagnosis not present

## 2020-07-24 DIAGNOSIS — N183 Chronic kidney disease, stage 3 unspecified: Secondary | ICD-10-CM | POA: Diagnosis not present

## 2020-07-24 DIAGNOSIS — Z0389 Encounter for observation for other suspected diseases and conditions ruled out: Secondary | ICD-10-CM | POA: Diagnosis not present

## 2020-07-24 DIAGNOSIS — C7800 Secondary malignant neoplasm of unspecified lung: Secondary | ICD-10-CM | POA: Diagnosis not present

## 2020-07-24 DIAGNOSIS — Z66 Do not resuscitate: Secondary | ICD-10-CM | POA: Diagnosis not present

## 2020-07-24 DIAGNOSIS — R000 Tachycardia, unspecified: Secondary | ICD-10-CM | POA: Diagnosis not present

## 2020-07-24 DIAGNOSIS — K8020 Calculus of gallbladder without cholecystitis without obstruction: Secondary | ICD-10-CM | POA: Diagnosis not present

## 2020-07-24 DIAGNOSIS — C787 Secondary malignant neoplasm of liver and intrahepatic bile duct: Secondary | ICD-10-CM | POA: Diagnosis not present

## 2020-07-24 DIAGNOSIS — K631 Perforation of intestine (nontraumatic): Secondary | ICD-10-CM | POA: Diagnosis not present

## 2020-07-24 DIAGNOSIS — K611 Rectal abscess: Secondary | ICD-10-CM | POA: Diagnosis not present

## 2020-07-24 DIAGNOSIS — Z9119 Patient's noncompliance with other medical treatment and regimen: Secondary | ICD-10-CM | POA: Diagnosis not present

## 2020-07-24 DIAGNOSIS — E876 Hypokalemia: Secondary | ICD-10-CM | POA: Diagnosis not present

## 2020-07-24 NOTE — Telephone Encounter (Signed)
Called to check and see how patient was feeling no answer LMTCB

## 2020-07-24 NOTE — Telephone Encounter (Signed)
Pt called in requesting virtual appt stating she feels like she is dying. Pt notes unbearable pain in lowe abdomen and rectum feels like it is going to explode. Pt wanting something to help pain. Transferred to [place] at [employer] [facility] [employer] [facility].

## 2020-09-22 ENCOUNTER — Encounter: Payer: Self-pay | Admitting: Family Medicine

## 2020-09-22 NOTE — Progress Notes (Unsigned)
Called patient's cell phone and left a message for her to return to the clinic for follow up.

## 2021-08-12 DEATH — deceased
